# Patient Record
Sex: Female | Born: 1975 | Race: Black or African American | Hispanic: No | Smoking: Never smoker
Health system: Southern US, Community
[De-identification: ages and names within clinical notes are randomized; demographics above are authoritative.]

## PROBLEM LIST (undated history)

## (undated) ENCOUNTER — Inpatient Hospital Stay (HOSPITAL_COMMUNITY): Payer: Self-pay

## (undated) DIAGNOSIS — D219 Benign neoplasm of connective and other soft tissue, unspecified: Secondary | ICD-10-CM

---

## 2002-12-12 HISTORY — PX: BREAST REDUCTION SURGERY: SHX8

## 2004-09-12 ENCOUNTER — Inpatient Hospital Stay (HOSPITAL_COMMUNITY): Admission: AD | Admit: 2004-09-12 | Discharge: 2004-09-12 | Payer: Self-pay | Admitting: Obstetrics and Gynecology

## 2004-11-22 ENCOUNTER — Inpatient Hospital Stay (HOSPITAL_COMMUNITY): Admission: AD | Admit: 2004-11-22 | Discharge: 2004-11-23 | Payer: Self-pay | Admitting: Obstetrics and Gynecology

## 2009-02-20 ENCOUNTER — Other Ambulatory Visit: Admission: RE | Admit: 2009-02-20 | Discharge: 2009-02-20 | Payer: Self-pay | Admitting: Family Medicine

## 2009-02-20 ENCOUNTER — Ambulatory Visit: Payer: Self-pay | Admitting: Family Medicine

## 2009-02-20 ENCOUNTER — Encounter: Payer: Self-pay | Admitting: Family Medicine

## 2009-02-20 DIAGNOSIS — R1084 Generalized abdominal pain: Secondary | ICD-10-CM | POA: Insufficient documentation

## 2009-02-20 DIAGNOSIS — R109 Unspecified abdominal pain: Secondary | ICD-10-CM | POA: Insufficient documentation

## 2009-02-20 LAB — CONVERTED CEMR LAB
Bilirubin Urine: NEGATIVE
Nitrite: NEGATIVE
Protein, U semiquant: NEGATIVE
Urobilinogen, UA: NEGATIVE
WBC Urine, dipstick: NEGATIVE

## 2009-02-24 ENCOUNTER — Encounter (INDEPENDENT_AMBULATORY_CARE_PROVIDER_SITE_OTHER): Payer: Self-pay | Admitting: *Deleted

## 2009-02-26 ENCOUNTER — Telehealth (INDEPENDENT_AMBULATORY_CARE_PROVIDER_SITE_OTHER): Payer: Self-pay | Admitting: *Deleted

## 2009-02-26 LAB — CONVERTED CEMR LAB
ALT: 16 units/L (ref 0–35)
AST: 21 units/L (ref 0–37)
Basophils Absolute: 0 10*3/uL (ref 0.0–0.1)
Basophils Relative: 0.1 % (ref 0.0–3.0)
Bilirubin, Direct: 0.1 mg/dL (ref 0.0–0.3)
CO2: 31 meq/L (ref 19–32)
Calcium: 9 mg/dL (ref 8.4–10.5)
Chloride: 103 meq/L (ref 96–112)
Creatinine, Ser: 1 mg/dL (ref 0.4–1.2)
Glucose, Bld: 85 mg/dL (ref 70–99)
Hemoglobin: 12.7 g/dL (ref 12.0–15.0)
LDL Cholesterol: 133 mg/dL — ABNORMAL HIGH (ref 0–99)
Lymphocytes Relative: 42.9 % (ref 12.0–46.0)
MCHC: 33.7 g/dL (ref 30.0–36.0)
Monocytes Relative: 8.8 % (ref 3.0–12.0)
Neutro Abs: 2.7 10*3/uL (ref 1.4–7.7)
Neutrophils Relative %: 44.2 % (ref 43.0–77.0)
RBC: 4.17 M/uL (ref 3.87–5.11)
TSH: 0.91 microintl units/mL (ref 0.35–5.50)
Total Bilirubin: 0.7 mg/dL (ref 0.3–1.2)
Total CHOL/HDL Ratio: 4.3
Total Protein: 6.9 g/dL (ref 6.0–8.3)
VLDL: 15 mg/dL (ref 0–40)
WBC: 6 10*3/uL (ref 4.5–10.5)

## 2009-03-05 ENCOUNTER — Encounter: Admission: RE | Admit: 2009-03-05 | Discharge: 2009-03-05 | Payer: Self-pay | Admitting: Family Medicine

## 2009-03-06 ENCOUNTER — Telehealth (INDEPENDENT_AMBULATORY_CARE_PROVIDER_SITE_OTHER): Payer: Self-pay | Admitting: *Deleted

## 2009-03-09 ENCOUNTER — Telehealth (INDEPENDENT_AMBULATORY_CARE_PROVIDER_SITE_OTHER): Payer: Self-pay | Admitting: *Deleted

## 2009-12-10 ENCOUNTER — Telehealth: Payer: Self-pay | Admitting: Family Medicine

## 2010-02-01 ENCOUNTER — Ambulatory Visit: Payer: Self-pay | Admitting: Family Medicine

## 2010-02-01 DIAGNOSIS — R05 Cough: Secondary | ICD-10-CM

## 2010-02-01 LAB — CONVERTED CEMR LAB
ALT: 12 units/L (ref 0–35)
AST: 15 units/L (ref 0–37)
Alkaline Phosphatase: 59 units/L (ref 39–117)
Basophils Relative: 0.7 % (ref 0.0–3.0)
Bilirubin, Direct: 0.1 mg/dL (ref 0.0–0.3)
Calcium: 9.1 mg/dL (ref 8.4–10.5)
Creatinine, Ser: 1 mg/dL (ref 0.4–1.2)
Eosinophils Absolute: 0.1 10*3/uL (ref 0.0–0.7)
Eosinophils Relative: 3.1 % (ref 0.0–5.0)
HDL: 49.3 mg/dL (ref >39.00)
Hemoglobin: 12.6 g/dL (ref 12.0–15.0)
LDL Cholesterol: 119 mg/dL — ABNORMAL HIGH (ref 0–99)
Lymphocytes Relative: 37 % (ref 12.0–46.0)
Monocytes Relative: 7.5 % (ref 3.0–12.0)
Neutro Abs: 1.9 10*3/uL (ref 1.4–7.7)
Neutrophils Relative %: 51.7 % (ref 43.0–77.0)
RBC: 3.91 M/uL (ref 3.87–5.11)
Sodium: 141 meq/L (ref 135–145)
Total CHOL/HDL Ratio: 4
Total Protein: 6.9 g/dL (ref 6.0–8.3)
Triglycerides: 51 mg/dL (ref 0.0–149.0)
WBC: 3.6 10*3/uL — ABNORMAL LOW (ref 4.5–10.5)

## 2010-02-15 LAB — CONVERTED CEMR LAB

## 2010-02-19 ENCOUNTER — Ambulatory Visit: Payer: Self-pay | Admitting: Family Medicine

## 2010-03-17 ENCOUNTER — Ambulatory Visit: Payer: Self-pay | Admitting: Family Medicine

## 2010-03-17 DIAGNOSIS — N390 Urinary tract infection, site not specified: Secondary | ICD-10-CM

## 2010-03-17 LAB — CONVERTED CEMR LAB
Bilirubin Urine: NEGATIVE
Blood in Urine, dipstick: NEGATIVE
Ketones, urine, test strip: NEGATIVE
Urobilinogen, UA: 0.2
pH: 7.5

## 2010-03-24 ENCOUNTER — Ambulatory Visit: Payer: Self-pay | Admitting: Family Medicine

## 2010-03-24 DIAGNOSIS — N898 Other specified noninflammatory disorders of vagina: Secondary | ICD-10-CM | POA: Insufficient documentation

## 2010-03-24 DIAGNOSIS — N760 Acute vaginitis: Secondary | ICD-10-CM | POA: Insufficient documentation

## 2010-03-24 DIAGNOSIS — A5901 Trichomonal vulvovaginitis: Secondary | ICD-10-CM | POA: Insufficient documentation

## 2010-03-24 LAB — CONVERTED CEMR LAB
Ketones, urine, test strip: NEGATIVE
Nitrite: NEGATIVE
Protein, U semiquant: NEGATIVE
Specific Gravity, Urine: 1.015
Whiff Test: POSITIVE
pH: 6

## 2010-03-25 ENCOUNTER — Encounter: Payer: Self-pay | Admitting: Family Medicine

## 2010-03-26 ENCOUNTER — Telehealth (INDEPENDENT_AMBULATORY_CARE_PROVIDER_SITE_OTHER): Payer: Self-pay | Admitting: *Deleted

## 2010-03-26 LAB — CONVERTED CEMR LAB: GC Probe Amp, Urine: NEGATIVE

## 2010-03-29 ENCOUNTER — Ambulatory Visit: Payer: Self-pay | Admitting: Family Medicine

## 2010-03-29 DIAGNOSIS — A5601 Chlamydial cystitis and urethritis: Secondary | ICD-10-CM

## 2010-06-01 ENCOUNTER — Ambulatory Visit: Payer: Self-pay | Admitting: Internal Medicine

## 2010-06-22 ENCOUNTER — Ambulatory Visit: Payer: Self-pay | Admitting: Family Medicine

## 2011-01-12 NOTE — Assessment & Plan Note (Signed)
Summary: PT IS HAVING DISCHARGE/KDC   Vital Signs:  Patient profile:   35 year old female Menstrual status:  regular Weight:      197 pounds Pulse rate:   72 / minute Pulse rhythm:   regular BP sitting:   122 / 80  (left arm) Cuff size:   regular  Vitals Entered By: Army Fossa CMA (March 24, 2010 10:12 AM) CC: Pt here for vaginal discharge. , Vaginal discharge   History of Present Illness:  Vaginal discharge      This is a 35 year old woman who presents with Vaginal discharge.  The patient complains of itching, but denies vaginal burning, burning on urination, frequency, urgency, fever, pelvic pain, and back pain.  The discharge is described as white and malodorous.  Risk factors for vaginal discharge include possible STD exposure.  The patient denies the following symptoms: genital sores, unusual vaginal bleeding, painful intercourse, rash, myalgias, arthralgias, and headache.  Prior treatment has included no prior treatment.    Current Medications (verified): 1)  Macrobid 100 Mg Caps (Nitrofurantoin Monohyd Macro) .Marland Kitchen.. 1 By Mouth Two Times A Day X7 Days 2)  Flagyl 500 Mg Tabs (Metronidazole) .Marland Kitchen.. 1 By Mouth Two Times A Day  Allergies (verified): No Known Drug Allergies  Past History:  Past medical, surgical, family and social histories (including risk factors) reviewed for relevance to current acute and chronic problems.  Past Medical History: Reviewed history from 02/20/2009 and no changes required. 3 children-natural 2 boys,1 girl 9,7 and 4  Mother-HTN, high cholesterol  Past Surgical History: Reviewed history from 02/20/2009 and no changes required. breast reduction 2004  Family History: Reviewed history from 02/01/2010 and no changes required. Maunt--bipolar  Family History Diabetes 1st degree relative Family History Hypertension Family History Thyroid disease  Social History: Reviewed history from 02/20/2009 and no changes required. 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 Genitalia:  no external lesions and vaginal discharge.   Psych:  Oriented X3 and normally interactive.     Impression & Recommendations:  Problem # 1:  TRICHOMONAL VAGINITIS (ICD-131.01)  flagyl 500 mg two times a day  pt to inform partner   Orders: UA Dipstick w/o Micro (manual) (16109) Wet Prep ([id number])  Problem # 2:  VAGINITIS, BACTERIAL (ICD-616.10)  Her updated medication list for this problem includes:    Macrobid 100 Mg Caps (Nitrofurantoin monohyd macro) .Marland Kitchen... 1 by mouth two times a day x7 days    Flagyl 500 Mg Tabs (Metronidazole) .Marland Kitchen... 1 by mouth two times a day  Discussed symptomatic relief and treatment options.   Orders: UA Dipstick w/o Micro (manual) (81191) Wet Prep ([id number])  Complete Medication List: 1)  Macrobid 100 Mg Caps (Nitrofurantoin monohyd macro) .Marland Kitchen.. 1 by mouth two times a day x7 days 2)  Flagyl 500 Mg Tabs (Metronidazole) .Marland Kitchen.. 1 by mouth two times a day  Other Orders: T-Chlamydia  Probe, urine [id number]) T-GC Probe, urine [id number]) T-Culture, Urine ([id number]) Prescriptions: FLAGYL 500 MG TABS (METRONIDAZOLE) 1 by mouth two times a day  #14 x 0   Entered and Authorized by:   Loreen Freud DO   Signed by:   Loreen Freud DO on 03/24/2010   Method used:  [facility] to        [id number] (retail)       [address]       [place], [place]  [address]       Ph: [phone] or [phone]       Fax: [phone]   RxID:   [id number]   Laboratory Results   Urine Tests    Routine Urinalysis   Color: yellow Appearance:  Clear Glucose: negative   (Normal Range: Negative) Bilirubin: negative   (Normal Range: Negative) Ketone: negative   (Normal Range: Negative) Spec. Gravity: 1.015   (Normal Range: 1.003-1.035) Blood: trace-intact   (Normal Range: Negative) pH: 6.0   (Normal Range: 5.0-8.0) Protein: negative   (Normal Range: Negative) Urobilinogen: 0.2   (Normal Range: 0-1) Nitrite: negative   (Normal Range: Negative) Leukocyte Esterace: small   (Normal Range: Negative)    Urine HCG: negative Comments: Army Fossa CMA  March 24, 2010 10:22 AM    Wet Mount Source: vaginal WBC/hpf: 10-20 Bacteria/hpf: 2+  Cocci Clue cells/hpf: many  Positive whiff Yeast/hpf: none Wet Mount KOH: Negative Trichomonas/hpf: many

## 2011-01-12 NOTE — Progress Notes (Signed)
Summary: Labs/Appt  Phone Note Outgoing Call   Call placed by: Army Fossa CMA,  March 26, 2010 10:14 AM Summary of Call: Marti Sleigh called pt to have pt schedule appt for treatment due to her lab results. Left message for pt to call back. Army Fossa CMA  March 26, 2010 10:15 AM   Follow-up for Phone Call        I called pt and left another message for her to call back. Army Fossa CMA  March 26, 2010 1:25 PM  urine Culture results-contaminated----if still symptomatic---repeat Signed by Loreen Freud DO on 03/26/2010 at 2:23 PM  Additional Follow-up for Phone Call Additional follow up Details #1::        Patient is coming in today for treatment Harold Barban  March 29, 2010 10:04 AM

## 2011-01-12 NOTE — Assessment & Plan Note (Signed)
Summary: treatment//lch   Vital Signs:  Patient profile:   35 year old female Menstrual status:  regular Weight:      197 pounds Pulse rate:   78 / minute Pulse rhythm:   regular BP sitting:   120 / 80  (left arm) Cuff size:   regular  Vitals Entered By: Army Fossa CMA (March 29, 2010 1:37 PM) CC: Pt here for treatment for [place]ym[place].    History of Present Illness: Pt here to go over recent lab results---+ chlamydia.  Pt is separated from husband and had  "a date"  with someone else.     Current Medications (verified): 1)  Macrobid 100 Mg Caps (Nitrofurantoin Monohyd Macro) .Marland Kitchen.. 1 By Mouth Two Times A Day X7 Days 2)  Flagyl 500 Mg Tabs (Metronidazole) .Marland Kitchen.. 1 By Mouth Two Times A Day 3)  Zithromax 1 Gm Pack (Azithromycin) .Marland Kitchen.. 1 Pack By Mouth X1  Allergies (verified): No Known Drug Allergies  Past History:  Past medical, surgical, family and social histories (including risk factors) reviewed for relevance to current acute and chronic problems.  Past Medical History: Reviewed history from 02/20/2009 and no changes required. 3 children-natural 2 boys,1 girl 9,7 and 4  Mother-HTN, high cholesterol  Past Surgical History: Reviewed history from 02/20/2009 and no changes required. breast reduction 2004  Family History: Reviewed history from 02/01/2010 and no changes required. Maunt--bipolar  Family History Diabetes 1st degree relative Family History Hypertension Family History Thyroid disease  Social History: Reviewed history from 02/20/2009 and no changes required. 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     Impression & Recommendations:  Problem # 1:  CHLAMYDIAL INFECTION  (ICD-099.41)  zithromax 1g x1 rocephin 250 mg IM   Orders: Admin of Therapeutic Inj  intramuscular or subcutaneous (16109) Rocephin  250mg  (U0454) Pt will inform her partner---d/w pt importance of using condoms  Complete Medication List: 1)  Macrobid 100 Mg Caps (Nitrofurantoin monohyd macro) .Marland Kitchen.. 1 by mouth two times a day x7 days 2)  Flagyl 500 Mg Tabs (Metronidazole) .Marland Kitchen.. 1 by mouth two times a day 3)  Zithromax 1 Gm Pack (Azithromycin) .Marland Kitchen.. 1 pack by mouth x1 Prescriptions: ZITHROMAX 1 GM PACK (AZITHROMYCIN) 1 pack by mouth x1  #1 x 0   Entered and Authorized by:   Loreen Freud DO   Signed by:   Loreen Freud DO on 03/29/2010   Method used:   [facility]ically to        [facility] [phone][facility] (retail)       [address]       [place], [place]  [address]       Ph: [phone] or [phone]       Fax: [phone]   RxID:   [id number] ZITHROMAX 250 MG TABS (AZITHROMYCIN) 4 tabs by mouth x1  #4 x 0   Entered and Authorized by:   Loreen Freud DO   Signed by:   Loreen Freud DO on 03/29/2010   Method used:   [facility]ronically to        [facility] [phone]* (retail)       [address]       [place]  [place], [place]  [address]       Ph: [phone] or [phone]       Fax: [phone]   RxID:   [id number]    Medication Administration  Injection # 1:    Medication: Rocephin  250mg     Diagnosis: CHLAMYDIAL INFECTION (ICD-099.41)    Route: IM    Site: RUOQ gluteus    Exp Date: 03/2011    Lot #: [id number]    Mfr: novaplus    Comments: received 500 mg    Patient tolerated injection without complications    Given by: Army Fossa CMA (March 29, 2010 2:29 PM)  Orders Added: 1)  Est. Patient Level II [57322] 2)  Admin of Therapeutic Inj  intramuscular or subcutaneous [96372] 3)  Rocephin  250mg  [J0696]

## 2011-01-12 NOTE — Assessment & Plan Note (Signed)
Summary: ? UTI/RH.......Marland Kitchen   Vital Signs:  Patient profile:   35 year old female Menstrual status:  regular Weight:      196 pounds Pulse rate:   70 / minute BP sitting:   110 / 80  (left arm)  Vitals Entered By: Doristine Devoid (March 17, 2010 9:15 AM) CC: uti sx x1 week- dysuria    History of Present Illness: 35 yo woman here today for 1 week of dysuria.  has had UTI in past s/p intercourse.  sxs started morning after intercourse.  + burning, hesitancy, frequency, urgency.  reports cloudy urine.  no fevers or chills.  no back pain.  Allergies (verified): No Known Drug Allergies  Review of Systems      See HPI  Physical Exam  General:  Well-developed,well-nourished,in no acute distress; alert,appropriate and cooperative throughout examination Abdomen:  no suprapubic or CVA tenderness   Impression & Recommendations:  Problem # 1:  UTI (ICD-599.0) Assessment New  sxs and presence of bacteria in urine consistent w/ UTI.  pt reports unable to take pills but can open capsules in apple sauce.  tx w/ Macrobid. Her updated medication list for this problem includes:    Macrobid 100 Mg Caps (Nitrofurantoin monohyd macro) .Marland Kitchen... 1 by mouth two times a day x7 days  Orders: UA Dipstick w/o Micro (manual) (16109) Specimen Handling (99000) T-Culture, Urine ([id number])  Complete Medication List: 1)  Macrobid 100 Mg Caps (Nitrofurantoin monohyd macro) .Marland Kitchen.. 1 by mouth two times a day x7 days  Patient Instructions: 1)  Please schedule a follow-up appointment as needed. 2)  Drink plenty of fluids 3)  Take the medicine as directed 4)  Urinate after sex to decrease chance of infection 5)  Hang in there!  Prescriptions: MACROBID 100 MG CAPS (NITROFURANTOIN MONOHYD MACRO) 1 by mouth two times a day x7 days  #14 x 0   Entered and Authorized by:   Neena Rhymes MD   Signed by:   Neena Rhymes MD on 03/17/2010   Method used:   Print then Give to Patient   RxID:    [id number]   Laboratory Results   Urine Tests    Routine Urinalysis   Glucose: negative   (Normal Range: Negative) Bilirubin: negative   (Normal Range: Negative) Ketone: negative   (Normal Range: Negative) Spec. Gravity: 1.015   (Normal Range: 1.003-1.035) Blood: negative   (Normal Range: Negative) pH: 7.5   (Normal Range: 5.0-8.0) Protein: negative   (Normal Range: Negative) Urobilinogen: 0.2   (Normal Range: 0-1) Nitrite: negative   (Normal Range: Negative) Leukocyte Esterace: large   (Normal Range: Negative)

## 2011-01-12 NOTE — Assessment & Plan Note (Signed)
Summary: CPX//PH   Vital Signs:  Patient profile:   35 year old female Menstrual status:  regular Height:      66.5 inches Weight:      207 pounds BMI:     33.03 Temp:     98.7 degrees F oral Pulse rate:   75 / minute Pulse rhythm:   regular BP sitting:   122 / 70  (left arm) Cuff size:   regular  Vitals Entered By: Army Fossa CMA (February 01, 2010 9:01 AM) CC: CPX, no pap (started period)   History of Present Illness: Pt here for cpe. No pap. She is on period.  Pt with no complaints except dry cough at night since summer time.     Preventive Screening-Counseling & Management  Alcohol-Tobacco     Alcohol drinks/day: <1     Alcohol type: daquari     >5/day in last 3 mos: no     Alcohol Counseling: not indicated; use of alcohol is not excessive or problematic     Smoking Status: never     Passive Smoke Exposure: no  Caffeine-Diet-Exercise     Caffeine use/day: no     Diet Comments: watch diet ,  water     Does Patient Exercise: no     Exercise Counseling: to improve exercise regimen  Hep-HIV-STD-Contraception     Dental Visit-last 6 months yes     Dental Care Counseling: not indicated; dental care within six months     SBE monthly: no     SBE Education/Counseling: to perform regular SBE  Safety-Violence-Falls     Seat Belt Use: yes     Seat Belt Counseling: not indicated; patient wears seat belts      Sexual History:  currently monogamous and married-- 3 children.    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 Maunt--bipolar  Family History Diabetes 1st degree relative Family History Hypertension Family History Thyroid disease  Social History: Reviewed history from 02/20/2009 and no changes required. 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 Eyes:  Denies blurring, discharge, double vision, eye irritation, eye pain, halos, itching, light sensitivity, red eye, vision loss-1 eye, and vision loss-both eyes; optho--q2y. ENT:  Denies decreased hearing, difficulty swallowing, ear discharge, earache, hoarseness, nasal congestion, nosebleeds, postnasal drainage, ringing in ears, sinus pressure, and sore throat. CV:  Denies bluish discoloration of lips or nails, chest pain  or discomfort, difficulty breathing at night, difficulty breathing while lying down, fainting, fatigue, leg cramps with exertion, lightheadness, near fainting, palpitations, shortness of breath with exertion, swelling of feet, swelling of hands, and weight gain. Resp:  Complains of cough; denies chest discomfort, chest pain with inspiration, coughing up blood, excessive snoring, hypersomnolence, morning headaches, pleuritic, shortness of  breath, sputum productive, and wheezing. GI:  Denies abdominal pain, bloody stools, change in bowel habits, constipation, dark tarry stools, diarrhea, excessive appetite, gas, hemorrhoids, indigestion, loss of appetite, nausea, vomiting, vomiting blood, and yellowish skin color. GU:  Denies abnormal vaginal bleeding, decreased libido, discharge, dysuria, genital sores, hematuria, incontinence, nocturia, urinary frequency, and urinary hesitancy. MS:  Complains of joint pain; denies joint redness, joint swelling, loss of strength, low back pain, mid back pain, muscle aches, muscle , cramps, muscle weakness, stiffness, and thoracic pain; Dr Evaristo Bury at [facility] ortho for bursitis . Derm:  Denies changes in color of skin, changes in nail beds, dryness, excessive perspiration, flushing, hair loss, insect bite(s), itching, lesion(s), poor wound healing, and rash. Neuro:  Denies brief paralysis, difficulty with concentration, disturbances in coordination, falling down, headaches, inability to speak, memory loss, numbness, poor balance, seizures, sensation of room spinning, tingling, tremors, visual disturbances, and weakness. Psych:  Denies alternate hallucination ( auditory/visual), anxiety, depression, easily angered, easily tearful, irritability, mental problems, panic attacks, sense of great danger, suicidal thoughts/plans, thoughts of violence, unusual visions or sounds, and thoughts /plans of harming others. Endo:  Denies cold intolerance, excessive hunger, excessive thirst, excessive urination, heat intolerance, polyuria, and weight change. Heme:  Denies abnormal bruising, bleeding, enlarge lymph nodes, fevers, pallor, and skin discoloration. Allergy:  Denies hives or rash, itching eyes, persistent infections, seasonal allergies, and sneezing.  Physical Exam  General:  Well-developed,well-nourished,in no acute distress; alert,appropriate and cooperative throughout examination Head:  Normocephalic and  atraumatic without obvious abnormalities. No apparent alopecia or balding. Eyes:  vision grossly intact, pupils equal, pupils round, pupils reactive to light, and no injection.   Ears:  External ear exam shows no significant lesions or deformities.  Otoscopic examination reveals clear canals, tympanic membranes are intact bilaterally without bulging, retraction, inflammation or discharge. Hearing is grossly normal bilaterally. Nose:  External nasal examination shows no deformity or inflammation. Nasal mucosa are pink and moist without lesions or exudates. Mouth:  Oral mucosa and oropharynx without lesions or exudates.  Teeth in good repair. Neck:  No deformities, masses, or tenderness noted. Chest Wall:  No deformities, masses, or tenderness noted. Breasts:  No mass, nodules, thickening, tenderness, bulging, retraction, inflamation, nipple discharge or skin changes noted.   Lungs:  Normal respiratory effort, chest expands symmetrically. Lungs are clear to auscultation, no crackles or wheezes. Heart:  normal rate, regular rhythm, and no murmur.   Abdomen:  Bowel sounds positive,abdomen soft and non-tender without masses, organomegaly or hernias noted. Genitalia:  deferred--pt on period Msk:  normal ROM, no joint tenderness, no joint swelling, no joint warmth, no redness over joints, no joint deformities, no joint instability, and no crepitation.   Pulses:  R posterior tibial normal, R dorsalis pedis normal, R carotid normal, L posterior tibial normal, L dorsalis pedis normal, and L carotid normal.   Extremities:  No clubbing, cyanosis, edema, or deformity noted with normal full range of motion of all joints.   Neurologic:  No cranial nerve deficits noted. Station and gait are normal. Plantar reflexes are down-going bilaterally. DTRs are symmetrical throughout. Sensory, motor and coordinative functions appear intact. Skin:  Intact  without suspicious lesions or rashes Cervical Nodes:  No  lymphadenopathy noted Axillary Nodes:  No palpable lymphadenopathy Psych:  Cognition and judgment appear intact. Alert and cooperative with normal attention span and concentration. No apparent delusions, illusions, hallucinations   Impression & Recommendations:  Problem # 1:  PREVENTIVE HEALTH CARE (ICD-V70.0) ghm utd  Orders: Venipuncture (98119) TLB-Lipid Panel (80061-LIPID) TLB-BMP (Basic Metabolic Panel-BMET) (80048-METABOL) TLB-CBC Platelet - w/Differential (85025-CBCD) TLB-Hepatic/Liver Function Pnl (80076-HEPATIC) TLB-TSH (Thyroid Stimulating Hormone) (84443-TSH) T-HIV Antibody  (Reflex) ([id number]) T-RPR (Syphilis) (30865-78469) T- * Misc. Laboratory test [phone]) T-GC Probe, urine ([phone]) T-Chlamydia  Probe, urine [phone])  Problem # 2:  COUGH (ICD-786.2) ? allergies take otc zyrtec or claritin Orders: Venipuncture (34742) TLB-Lipid Panel (80061-LIPID) TLB-BMP (Basic Metabolic Panel-BMET) (80048-METABOL) TLB-CBC Platelet - w/Differential (85025-CBCD) TLB-Hepatic/Liver Function Pnl (80076-HEPATIC) TLB-TSH (Thyroid Stimulating Hormone) (84443-TSH) T-HIV Antibody  (Reflex) ([id number]) T-RPR (Syphilis) (33295-18841) T- * Misc. Laboratory test [phone]) T-Allergy Profile [place], [place], [place], [place], [place] [phone])  Patient Instructions: 1)  RTO pap only in 2 weeks    Flu Vaccine Next Due:  Refused

## 2011-01-27 ENCOUNTER — Encounter: Payer: Self-pay | Admitting: Family Medicine

## 2011-02-15 ENCOUNTER — Encounter: Payer: Self-pay | Admitting: Family Medicine

## 2011-02-15 DIAGNOSIS — Z0289 Encounter for other administrative examinations: Secondary | ICD-10-CM

## 2011-03-30 ENCOUNTER — Encounter: Payer: Self-pay | Admitting: Family Medicine

## 2011-04-04 ENCOUNTER — Encounter: Payer: Self-pay | Admitting: Family Medicine

## 2011-04-28 ENCOUNTER — Ambulatory Visit (INDEPENDENT_AMBULATORY_CARE_PROVIDER_SITE_OTHER): Payer: Self-pay | Admitting: Family Medicine

## 2011-04-28 DIAGNOSIS — R300 Dysuria: Secondary | ICD-10-CM

## 2011-04-28 NOTE — Progress Notes (Signed)
  Subjective:    Patient ID: Leslie Richardson, female    DOB: 20-Feb-1976, 35 y.o.   MRN: [id number]  HPI  Pt no showed  Review of Systems     Objective:   Physical Exam        Assessment & Plan:

## 2011-04-28 NOTE — Patient Instructions (Signed)

## 2011-05-11 ENCOUNTER — Encounter: Payer: Self-pay | Admitting: Family Medicine

## 2011-05-16 ENCOUNTER — Encounter: Payer: Self-pay | Admitting: Family Medicine

## 2011-05-18 ENCOUNTER — Ambulatory Visit (INDEPENDENT_AMBULATORY_CARE_PROVIDER_SITE_OTHER): Payer: Self-pay | Admitting: Family Medicine

## 2011-05-18 DIAGNOSIS — Z0000 Encounter for general adult medical examination without abnormal findings: Secondary | ICD-10-CM

## 2011-05-18 NOTE — Progress Notes (Signed)
  Subjective:    Patient ID: Leslie Richardson, female    DOB: 13-Oct-1976, 35 y.o.   MRN: [id number]  HPI   No show Review of Systems     Objective:   Physical Exam        Assessment & Plan:

## 2012-06-13 ENCOUNTER — Encounter (HOSPITAL_BASED_OUTPATIENT_CLINIC_OR_DEPARTMENT_OTHER): Payer: Self-pay | Admitting: *Deleted

## 2012-06-13 ENCOUNTER — Emergency Department (HOSPITAL_BASED_OUTPATIENT_CLINIC_OR_DEPARTMENT_OTHER): Payer: Self-pay

## 2012-06-13 ENCOUNTER — Emergency Department (HOSPITAL_BASED_OUTPATIENT_CLINIC_OR_DEPARTMENT_OTHER)
Admission: EM | Admit: 2012-06-13 | Discharge: 2012-06-13 | Disposition: A | Discharge status: Home / Self Care | Payer: Self-pay | Attending: Emergency Medicine | Admitting: Emergency Medicine

## 2012-06-13 ENCOUNTER — Ambulatory Visit: Payer: Self-pay | Admitting: Internal Medicine

## 2012-06-13 DIAGNOSIS — R6889 Other general symptoms and signs: Secondary | ICD-10-CM | POA: Insufficient documentation

## 2012-06-13 DIAGNOSIS — Z818 Family history of other mental and behavioral disorders: Secondary | ICD-10-CM | POA: Insufficient documentation

## 2012-06-13 DIAGNOSIS — T17208A Unspecified foreign body in pharynx causing other injury, initial encounter: Secondary | ICD-10-CM

## 2012-06-13 DIAGNOSIS — Z8349 Family history of other endocrine, nutritional and metabolic diseases: Secondary | ICD-10-CM | POA: Insufficient documentation

## 2012-06-13 DIAGNOSIS — Z8249 Family history of ischemic heart disease and other diseases of the circulatory system: Secondary | ICD-10-CM | POA: Insufficient documentation

## 2012-06-13 DIAGNOSIS — Z833 Family history of diabetes mellitus: Secondary | ICD-10-CM | POA: Insufficient documentation

## 2012-06-13 MED ORDER — CLINDAMYCIN HCL 300 MG PO CAPS: 300.0000 mg | ORAL_CAPSULE | Freq: Four times a day (QID) | ORAL | Status: AC

## 2012-06-13 NOTE — ED Notes (Signed)
Returned from XR 

## 2012-06-13 NOTE — ED Notes (Signed)
Pt denies difficulty swallowing food/fluids/secretions. Denies airway impairment.

## 2012-06-13 NOTE — ED Provider Notes (Signed)
History     CSN: [id number]  Arrival date & time 06/13/12  0209   First MD Initiated Contact with Patient 06/13/12 0235      Chief Complaint  Patient presents with  . Swallowed Foreign Body    (Consider location/radiation/quality/duration/timing/severity/associated sxs/prior treatment) Patient is a 36 y.o. female presenting with foreign body swallowed. The history is provided by the patient. No language interpreter was used.  Swallowed Foreign Body This is a new problem. The current episode started 2 days ago. The problem occurs constantly. The problem has not changed since onset.Pertinent negatives include no chest pain, no abdominal pain, no headaches and no shortness of breath. Nothing aggravates the symptoms. Nothing relieves the symptoms. She has tried water for the symptoms. The treatment provided no relief.  States the wire between her braces was sticking into her gum on Monday so she cut it with curved fingernail cutters and it went in her throat, she thought she felt it and tried to dislodge it with the clippers and has noted blood.  Still feels it.  No f/c/r.    History reviewed. No pertinent past medical history.  Past Surgical History  Procedure Date  . Breast reduction surgery 2004    Family History  Problem Relation Age of Onset  . Hypertension Mother   . Hyperlipidemia Mother   . Bipolar disorder Maternal Aunt   . Diabetes    . Thyroid disease    . Hypertension      History  Substance Use Topics  . Smoking status: Never Smoker   . Smokeless tobacco: Not on file  . Alcohol Use: No    OB History    Grav Para Term Preterm Abortions TAB SAB Ect Mult Living                  Review of Systems  Respiratory: Negative for shortness of breath.   Cardiovascular: Negative for chest pain.  Gastrointestinal: Negative for abdominal pain.  Neurological: Negative for headaches.  All other systems reviewed and are negative.    Allergies  Review of patient's  allergies indicates no known allergies.  Home Medications   Current Outpatient Rx  Name Route Sig Dispense Refill  . NITROFURANTOIN MONOHYD MACRO 100 MG PO CAPS Oral Take 100 mg by mouth 2 (two) times daily. For 7 days       BP 146/83  Pulse 66  Temp 98.4 F (36.9 C) (Oral)  Resp 18  Ht 5\' 6"  (1.676 m)  Wt 210 lb (95.255 kg)  BMI 33.89 kg/m2  SpO2 100%  LMP 03/28/2012  Physical Exam  Constitutional: She is oriented to person, place, and time. She appears well-developed and well-nourished. No distress.  HENT:  Head: Normocephalic and atraumatic.  Mouth/Throat: Oropharynx is clear and moist.  Eyes: Conjunctivae are normal. Pupils are equal, round, and reactive to light.  Neck: Normal range of motion. Neck supple.  Cardiovascular: Normal rate and regular rhythm.   Pulmonary/Chest: Effort normal and breath sounds normal. No stridor. She has no wheezes.  Abdominal: Soft. Bowel sounds are normal.  Musculoskeletal: Normal range of motion.  Lymphadenopathy:    She has no cervical adenopathy.  Neurological: She is alert and oriented to person, place, and time.  Skin: Skin is warm and dry.  Psychiatric: She has a normal mood and affect.    ED Course  Procedures (including critical care time)   Labs Reviewed  PREGNANCY, URINE   No results found.   No diagnosis found.  MDM  89 am case d/w Dr. Suszanne Conners.  Due to concern for retained foreign body and laceration caused by self instrumentation patient to be prescribed antibiotics and Dr. Suszanne Conners will see patient in the office at 1030 am for scope.  Patient is NPO as of now.  Patient informed antibiotics can invalidate oral contraception and to use barrier protection x 1 month to prevent pregnancy.  Patient verbalizes understanding and agrees to follow up        Zyren Sevigny Smitty Cords, MD 06/13/12 [phone]

## 2012-06-13 NOTE — ED Notes (Signed)
Pt has braces. States there was a metal piece sticking into her gum, so she took fingernail clippers to clip it on Monday, and it fell into her throat. States she felt like she could pass it by drinking fluids, but that the metal piece is stuck against her throat. Pt attempted to dislodge it and noticed small amt of blood. Pt also c/o possible sty to left eye. Using warm compresses without relief.

## 2013-06-16 ENCOUNTER — Telehealth (HOSPITAL_COMMUNITY): Payer: Self-pay | Admitting: *Deleted

## 2013-06-16 ENCOUNTER — Emergency Department (HOSPITAL_BASED_OUTPATIENT_CLINIC_OR_DEPARTMENT_OTHER)
Admission: EM | Admit: 2013-06-16 | Discharge: 2013-06-16 | Disposition: A | Discharge status: Home / Self Care | Payer: Medicaid Other | Attending: Emergency Medicine | Admitting: Emergency Medicine

## 2013-06-16 ENCOUNTER — Emergency Department (HOSPITAL_BASED_OUTPATIENT_CLINIC_OR_DEPARTMENT_OTHER): Payer: Medicaid Other

## 2013-06-16 ENCOUNTER — Encounter (HOSPITAL_BASED_OUTPATIENT_CLINIC_OR_DEPARTMENT_OTHER): Payer: Self-pay | Admitting: *Deleted

## 2013-06-16 DIAGNOSIS — O99891 Other specified diseases and conditions complicating pregnancy: Secondary | ICD-10-CM | POA: Insufficient documentation

## 2013-06-16 DIAGNOSIS — O039 Complete or unspecified spontaneous abortion without complication: Secondary | ICD-10-CM

## 2013-06-16 DIAGNOSIS — R5381 Other malaise: Secondary | ICD-10-CM | POA: Insufficient documentation

## 2013-06-16 LAB — CBC WITH DIFFERENTIAL/PLATELET
Basophils Absolute: 0 10*3/uL (ref 0.0–0.1)
Eosinophils Absolute: 0.1 10*3/uL (ref 0.0–0.7)
Eosinophils Relative: 2 % (ref 0–5)
MCH: 30.3 pg (ref 26.0–34.0)
MCV: 86.4 fL (ref 78.0–100.0)
Platelets: 275 10*3/uL (ref 150–400)
RDW: 13.4 % (ref 11.5–15.5)

## 2013-06-16 LAB — HCG, QUANTITATIVE, PREGNANCY: hCG, Beta Chain, Quant, S: 2457 m[IU]/mL — ABNORMAL HIGH (ref <5)

## 2013-06-16 LAB — URINE MICROSCOPIC-ADD ON

## 2013-06-16 LAB — PREGNANCY, URINE: Preg Test, Ur: POSITIVE — AB

## 2013-06-16 LAB — WET PREP, GENITAL
Trich, Wet Prep: NONE SEEN
Yeast Wet Prep HPF POC: NONE SEEN

## 2013-06-16 LAB — URINALYSIS, ROUTINE W REFLEX MICROSCOPIC
Glucose, UA: NEGATIVE mg/dL
Specific Gravity, Urine: 1.026 (ref 1.005–1.030)

## 2013-06-16 NOTE — ED Notes (Signed)
Pt states unsure of last menstrual period and that her pregnancy was confirmed by the health department "over a month ago" - but unsure of date of that appt.

## 2013-06-16 NOTE — ED Provider Notes (Signed)
History    CSN: [id number] Arrival date & time 06/16/13  1112  First MD Initiated Contact with Patient 06/16/13 1153     Chief Complaint  Patient presents with  . Vaginal Discharge   (Consider location/radiation/quality/duration/timing/severity/associated sxs/prior Treatment) Patient is a 37 y.o. female presenting with vaginal bleeding. The history is provided by the patient.  Vaginal Bleeding Quality:  Spotting and dark red Severity:  Mild Onset quality:  Gradual Duration:  1 week Timing:  Rare Progression:  Worsening Chronicity:  New Menstrual history:  Irregular (breast feeding) Number of pads used:  None Possible pregnancy: yes   Relieved by:  Nothing Worsened by:  Nothing tried Associated symptoms: fatigue and vaginal discharge   Associated symptoms: no abdominal pain, no back pain, no dizziness, no dyspareunia, no dysuria, no fever and no nausea   Risk factors: unprotected sex   Risk factors: no bleeding disorder, no hx of ectopic pregnancy, no hx of endometriosis, no gynecological surgery, does not have multiple partners, no new sexual partner, no ovarian cysts, no prior miscarriage, no STD, no STD exposure and no terminated pregnancies    Leslie Richardson is a 37 y.o. female who presents to the ED with vaginal bleeding. She describes the bleeding as less than a period. The color is dark brown. She states that she is pregnant but not sure how far along. Unsure of LMP. She is breast feeing her one year old.  History reviewed. No pertinent past medical history. Past Surgical History  Procedure Laterality Date  . Breast reduction surgery  2004   Family History  Problem Relation Age of Onset  . Hypertension Mother   . Hyperlipidemia Mother   . Bipolar disorder Maternal Aunt   . Diabetes    . Thyroid disease    . Hypertension     History  Substance Use Topics  . Smoking status: Never Smoker   . Smokeless tobacco: Not on file  . Alcohol Use: No   OB History    Grav Para Term Preterm Abortions TAB SAB Ect Mult Living                 Review of Systems  Constitutional: Positive for fatigue. Negative for fever, chills and activity change.  HENT: Negative for neck pain.   Respiratory: Negative for cough and shortness of breath.   Cardiovascular: Negative for leg swelling.  Gastrointestinal: Negative for nausea and abdominal pain.  Genitourinary: Positive for vaginal bleeding and vaginal discharge. Negative for dysuria, frequency and dyspareunia.  Musculoskeletal: Negative for back pain.  Skin: Negative for rash.  Neurological: Negative for dizziness.  Psychiatric/Behavioral: The patient is not nervous/anxious.     Allergies  Review of patient's allergies indicates no known allergies.  Home Medications   Current Outpatient Rx  Name  Route  Sig  Dispense  Refill  . nitrofurantoin, macrocrystal-monohydrate, (MACROBID) 100 MG capsule   Oral   Take 100 mg by mouth 2 (two) times daily. For 7 days           There were no vitals taken for this visit. Physical Exam  Nursing note and vitals reviewed. Constitutional: She is oriented to person, place, and time. She appears well-developed and well-nourished. No distress.  HENT:  Head: Normocephalic.  Eyes: EOM are normal.  Neck: Neck supple.  Cardiovascular: Normal rate.   Pulmonary/Chest: Effort normal.  Abdominal: Soft. Bowel sounds are normal. There is no tenderness.  Genitourinary:  External genitalia without lesions. Moderate dark blood vaginal vault.  Cervix dilated. No CMT, no adnexal tenderness, uterus slightly enlarged.  Musculoskeletal: Normal range of motion.  Neurological: She is alert and oriented to person, place, and time. No cranial nerve deficit.  Skin: Skin is warm and dry.  Psychiatric: She has a normal mood and affect. Her behavior is normal. Thought content normal.   Results for orders placed during the hospital encounter of 06/16/13 (from the past 24 hour(s))   URINALYSIS, ROUTINE W REFLEX MICROSCOPIC     Status: Abnormal   Collection Time    06/16/13 11:44 AM      Result Value Range   Color, Urine AMBER (*) YELLOW   APPearance CLOUDY (*) CLEAR   Specific Gravity, Urine 1.026  1.005 - 1.030   pH 6.0  5.0 - 8.0   Glucose, UA NEGATIVE  NEGATIVE mg/dL   Hgb urine dipstick LARGE (*) NEGATIVE   Bilirubin Urine NEGATIVE  NEGATIVE   Ketones, ur NEGATIVE  NEGATIVE mg/dL   Protein, ur NEGATIVE  NEGATIVE mg/dL   Urobilinogen, UA 0.2  0.0 - 1.0 mg/dL   Nitrite NEGATIVE  NEGATIVE   Leukocytes, UA SMALL (*) NEGATIVE  PREGNANCY, URINE     Status: Abnormal   Collection Time    06/16/13 11:44 AM      Result Value Range   Preg Test, Ur POSITIVE (*) NEGATIVE  URINE MICROSCOPIC-ADD ON     Status: Abnormal   Collection Time    06/16/13 11:44 AM      Result Value Range   Squamous Epithelial / LPF FEW (*) RARE   WBC, UA 0-2  <3 WBC/hpf   RBC / HPF TOO NUMEROUS TO COUNT  <3 RBC/hpf   Bacteria, UA MANY (*) RARE   Urine-Other MUCOUS PRESENT    CBC WITH DIFFERENTIAL     Status: None   Collection Time    06/16/13 12:20 PM      Result Value Range   WBC 6.3  4.0 - 10.5 K/uL   RBC 4.19  3.87 - 5.11 MIL/uL   Hemoglobin 12.7  12.0 - 15.0 g/dL   HCT 19.1  47.8 - 29.5 %   MCV 86.4  78.0 - 100.0 fL   MCH 30.3  26.0 - 34.0 pg   MCHC 35.1  30.0 - 36.0 g/dL   RDW 62.1  30.8 - 65.7 %   Platelets 275  150 - 400 K/uL   Neutrophils Relative % 55  43 - 77 %   Neutro Abs 3.4  1.7 - 7.7 K/uL   Lymphocytes Relative 36  12 - 46 %   Lymphs Abs 2.2  0.7 - 4.0 K/uL   Monocytes Relative 7  3 - 12 %   Monocytes Absolute 0.5  0.1 - 1.0 K/uL   Eosinophils Relative 2  0 - 5 %   Eosinophils Absolute 0.1  0.0 - 0.7 K/uL   Basophils Relative 1  0 - 1 %   Basophils Absolute 0.0  0.0 - 0.1 K/uL  HCG, QUANTITATIVE, PREGNANCY     Status: Abnormal   Collection Time    06/16/13 12:20 PM      Result Value Range   hCG, Beta Chain, Quant, S 2457 (*) <5 mIU/mL  WET PREP, GENITAL      Status: Abnormal   Collection Time    06/16/13 12:22 PM      Result Value Range   Yeast Wet Prep HPF POC NONE SEEN  NONE SEEN   Trich, Wet Prep NONE SEEN  NONE SEEN   Clue Cells Wet Prep HPF POC FEW (*) NONE SEEN   WBC, Wet Prep HPF POC FEW (*) NONE SEEN     ED Course  Procedures Bedside ultrasound with early IUGS with YS approximately 6 weeks. Unable to visualize cardiac activity. Will order formal ultrasound with transvaginal probe to try and identify heart beat.   MDM  US Ob Comp Less 14 Wks  06/16/2013   , *RADIOLOGY REPORT*  Clinical Data: Vaginal bleeding.  OBSTETRIC <14 WK ULTRASOUND, TRANSVAGINAL OB US  Technique:  Transabdominal and transvaginal ultrasound was performed for evaluation of the gestation as well as the maternal uterus and adnexal regions.  Findings:  There is a single intrauterine gestational sac containing an embryo.  The gestational sac is elongated and there is no yolk sac visible.  No fetal heart rate identified.  CRL:  10.4 mm         7w  1d               [place] EDC: 02/01/2014  Maternal uterus/adnexae: No subchorionic hemorrhage.  Right ovary appears normal.  Small cyst is identified within the left adnexa measuring 1.5 cm.  IMPRESSION:  1.  There is a single intrauterine gestation.  This has an estimated gestational age of [redacted] weeks and 1 day. 2.  No evidence for fetal heart rate. The finding of a embryo with crown-rump length greater than are equal to 7 mm and no heart beat on transvaginal scan is definitive for failed pregnancy.   Original Report Authenticated By: Signa Kell, M.D.   US Ob Transvaginal  06/16/2013   , *RADIOLOGY REPORT*  Clinical Data: Vaginal bleeding.  OBSTETRIC <14 WK ULTRASOUND, TRANSVAGINAL OB US  Technique:  Transabdominal and transvaginal ultrasound was performed for evaluation of the gestation as well as the maternal uterus and adnexal regions.  Findings:  There is a single intrauterine gestational sac containing an embryo.  The gestational sac  is elongated and there is no yolk sac visible.  No fetal heart rate identified.  CRL:  10.4 mm         7w  1d               [place] EDC: 02/01/2014  Maternal uterus/adnexae: No subchorionic hemorrhage.  Right ovary appears normal.  Small cyst is identified within the left adnexa measuring 1.5 cm.  IMPRESSION:  1.  There is a single intrauterine gestation.  This has an estimated gestational age of [redacted] weeks and 1 day. 2.  No evidence for fetal heart rate. The finding of a embryo with crown-rump length greater than are equal to 7 mm and no heart beat on transvaginal scan is definitive for failed pregnancy.   Original Report Authenticated By: Signa Kell, M.D.     37 y.o. female with fetal demise at 7.[redacted] weeks gestation.  I have reviewed this patient's vital signs, nurses notes, appropriate labs and imaging.  I have discussed in detail with the patient findings and plan of care. Discussed options of follow up of SAB. Patient elects expectant management. We discussed in detail if she has heavy bleeding, more than a pad an hour, severe pain, feeling weak and dizzy, fever or any problems to go to [facility] for evaluation or return here as needed. She voices understanding. She will take Advil for pain.  Discussed with the patient that she will need follow up in 2 weeks for reevaluation of SAB.  [facility] Orlene Och, NP 06/16/13 1346  Hope  Orlene Och, NP 06/16/13 1347

## 2013-06-16 NOTE — ED Provider Notes (Signed)
Medical screening examination/treatment/procedure(s) were performed by non-physician practitioner and as supervising physician I was immediately available for consultation/collaboration.    Nelia Shi, MD 06/16/13 [phone]

## 2013-06-16 NOTE — ED Notes (Addendum)
States that she is pregnant, but doesn't know how far along she is. Now states that she is having a small amount of brownish d/c that she only notices when she wipes. Denies abd pain, and bleeding. Patient can not recall when her last period was, states "several months"

## 2013-06-16 NOTE — ED Notes (Signed)
Pelvic cart is at the bedside set up and ready for the doctor to use. 

## 2013-06-16 NOTE — ED Notes (Signed)
Urine collected by EMT Asher Muir.

## 2013-06-17 LAB — GC/CHLAMYDIA PROBE AMP: CT Probe RNA: NEGATIVE

## 2013-10-19 IMAGING — US US OB COMP LESS 14 WK
1 series · 14 of 28 positions shown · non-contrast
Comparison: none

, *RADIOLOGY REPORT*
CLINICAL DATA: Vaginal bleeding.

OBSTETRIC <14 WK ULTRASOUND, TRANSVAGINAL OB US
TECHNIQUE: Transabdominal and transvaginal ultrasound was
performed for evaluation of the gestation as well as the maternal
uterus and adnexal regions.

[Series 1: us ob comp less 14 wk · 0.23mm/px · 51 acquisitions, 14 frames shown]
[im 2/51]
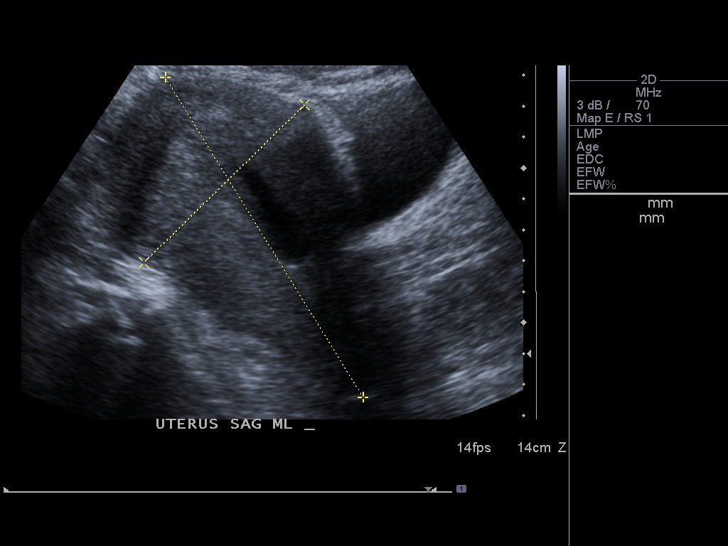
[im 6/51]
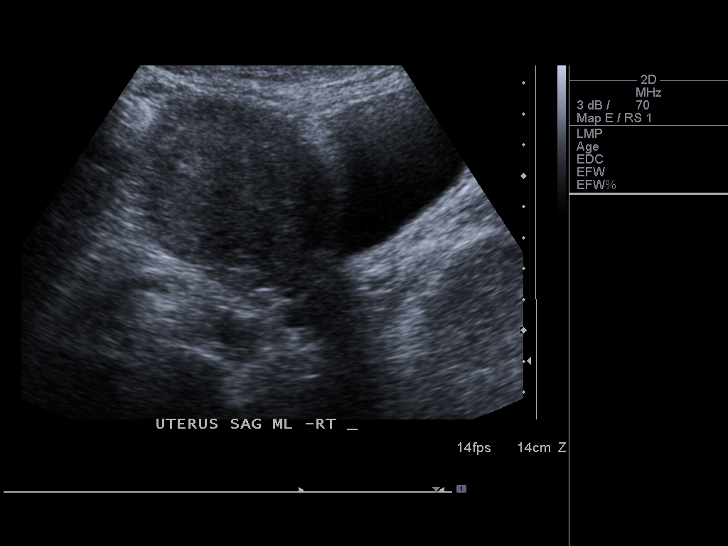
[im 10/51]
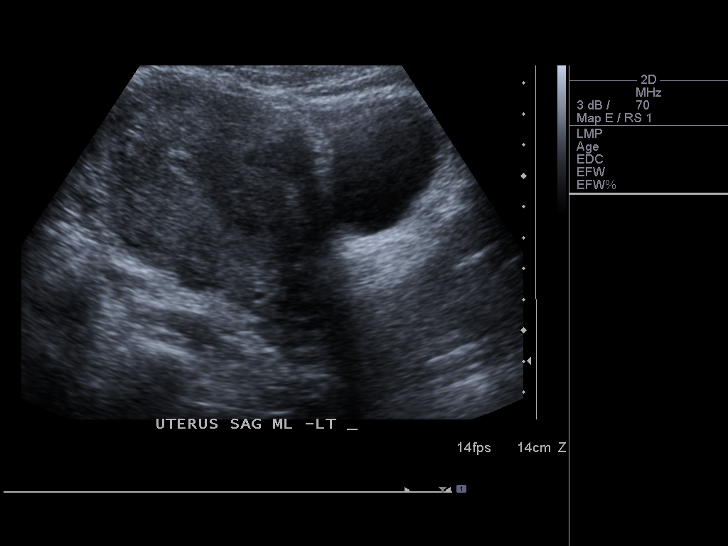
[im 13/51]
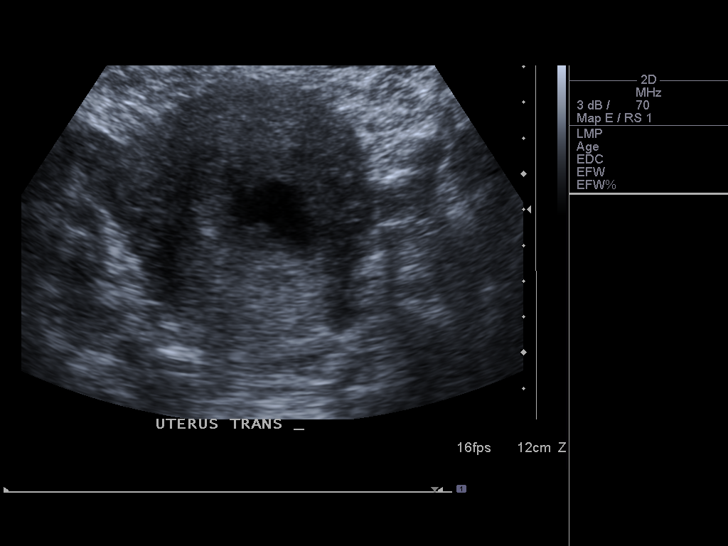
[im 17/51]
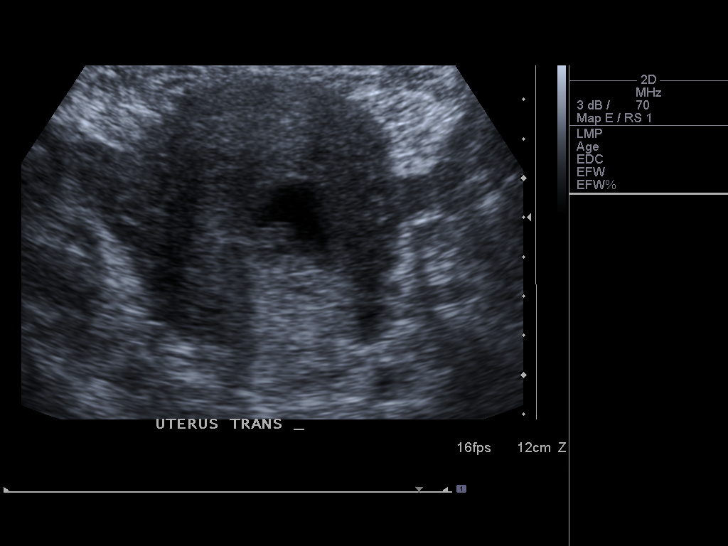
[im 21/51]
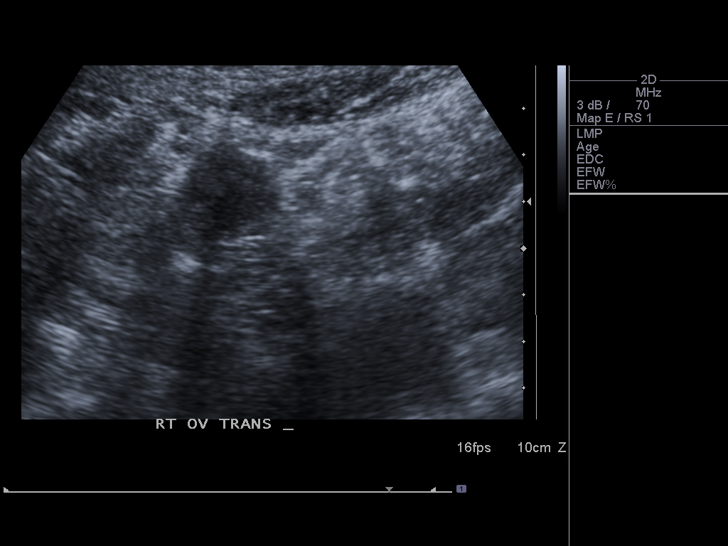
[im 25/51]
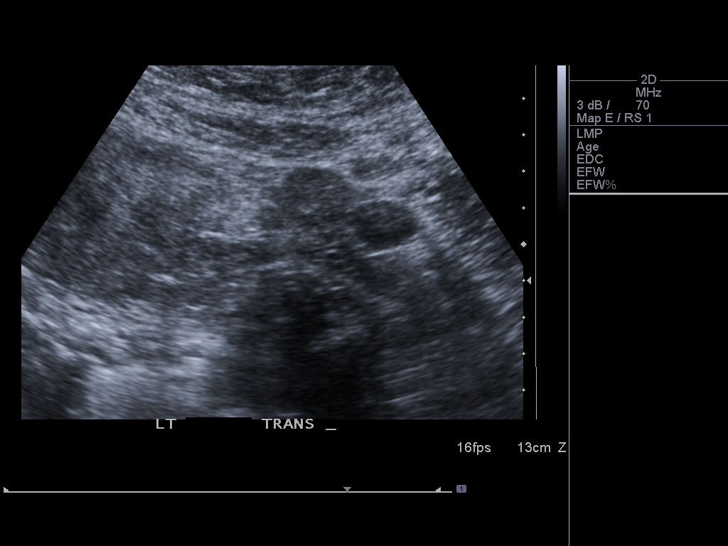
[im 28/51]
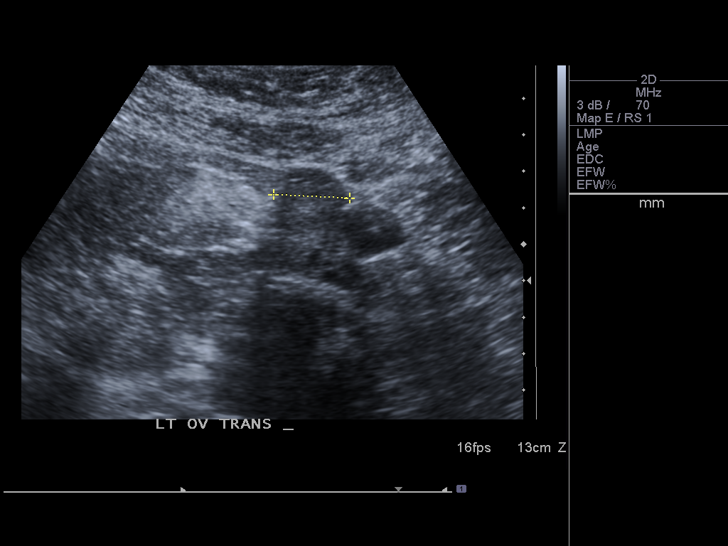
[im 32/51]
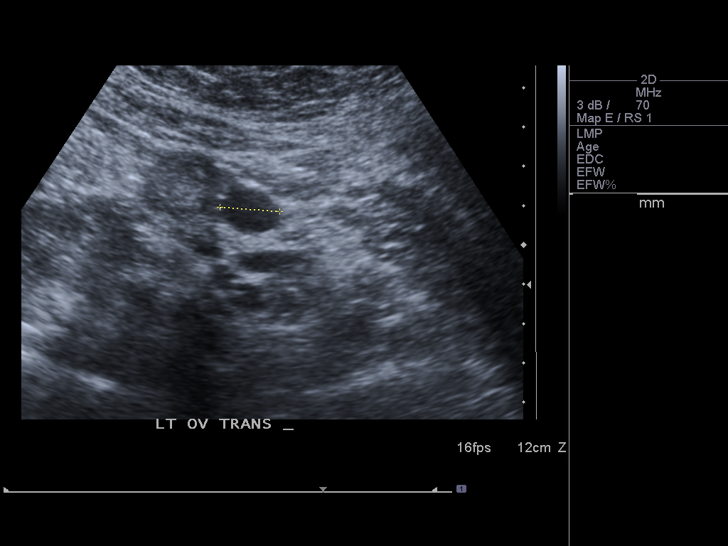
[im 36/51]
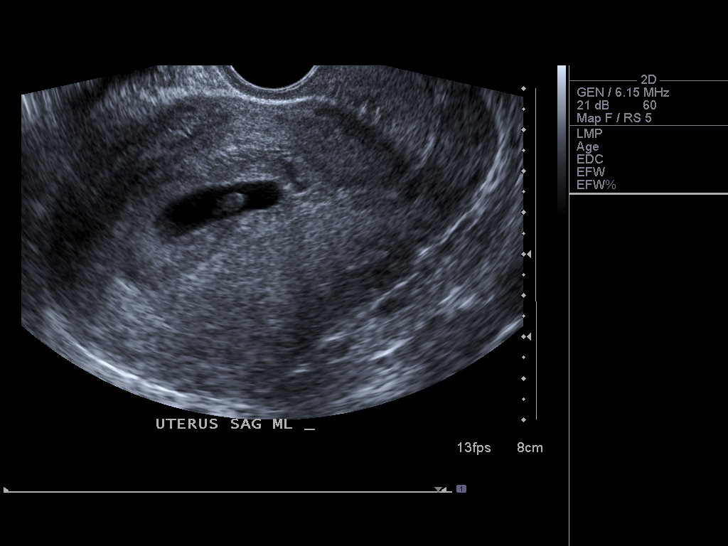
[im 39/51]
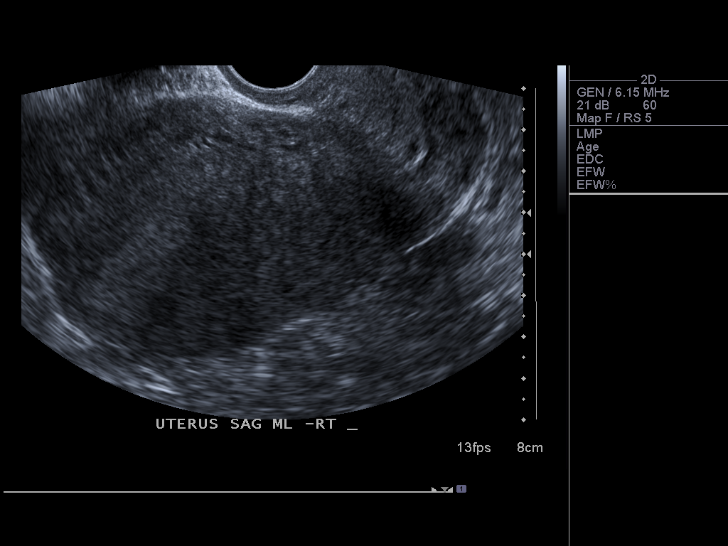
[im 43/51]
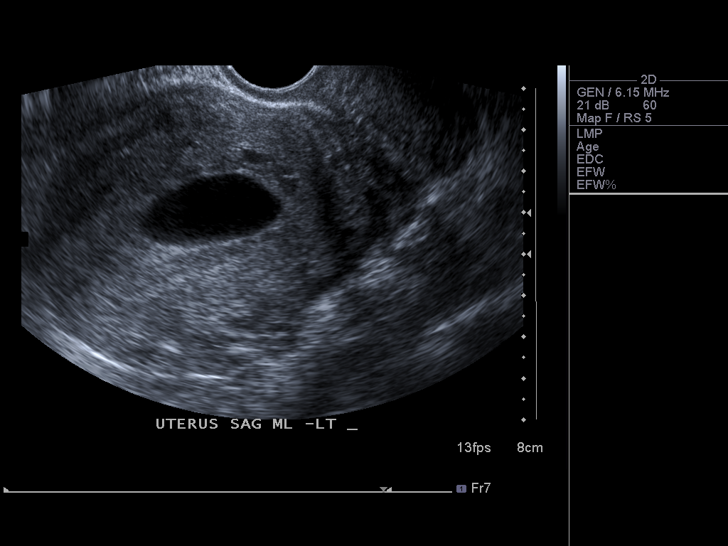
[im 47/51]
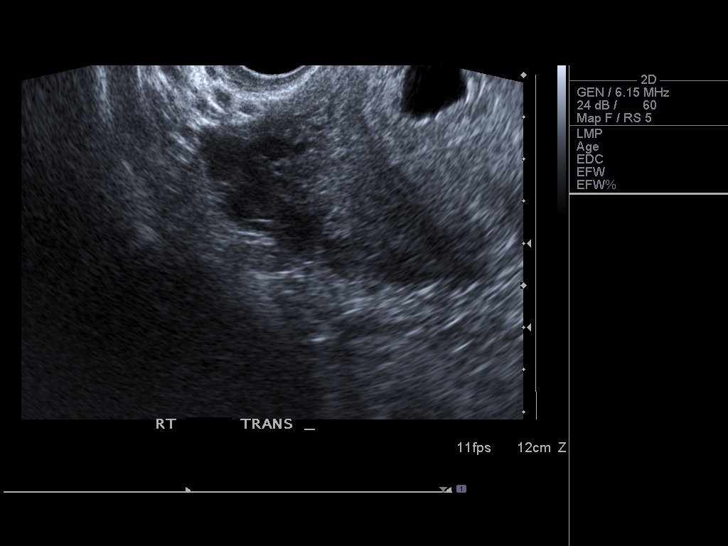
[im 51/51]
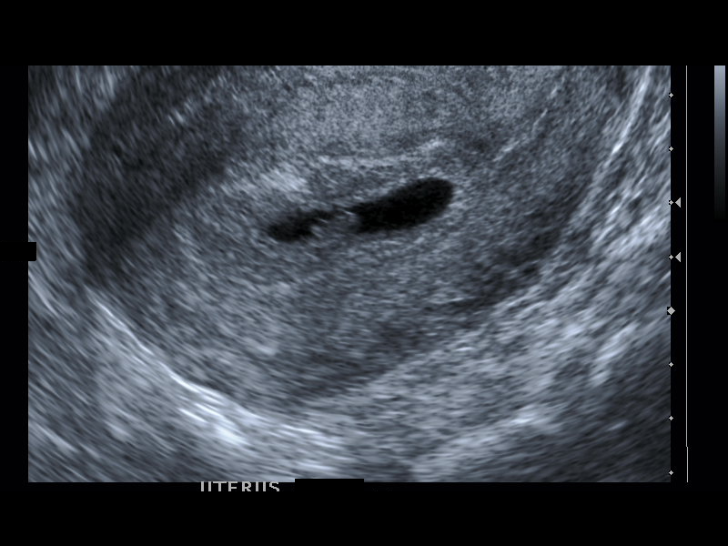

[14 of 28 positions shown; findings below may reference images not displayed]

FINDINGS: There is a single intrauterine gestational sac
containing an embryo.  The gestational sac is elongated and there
is no yolk sac visible.  No fetal heart rate identified.

CRL:  10.4 mm         7w  1d               [place] EDC: 02/01/2014

Maternal uterus/adnexae:
No subchorionic hemorrhage.  Right ovary appears normal.  Small
cyst is identified within the left adnexa measuring 1.5 cm.
IMPRESSION: 1.  There is a single intrauterine gestation.  This has an
estimated gestational age of 7 weeks and 1 day.
2.  No evidence for fetal heart rate. The finding of a embryo with
crown-rump length greater than are equal to 7 mm and no heart beat
on transvaginal scan is definitive for failed pregnancy.

## 2014-10-13 ENCOUNTER — Encounter (HOSPITAL_BASED_OUTPATIENT_CLINIC_OR_DEPARTMENT_OTHER): Payer: Self-pay | Admitting: *Deleted

## 2015-07-18 ENCOUNTER — Inpatient Hospital Stay (HOSPITAL_COMMUNITY): Payer: Medicaid Other

## 2015-07-18 ENCOUNTER — Encounter (HOSPITAL_COMMUNITY): Payer: Self-pay

## 2015-07-18 ENCOUNTER — Inpatient Hospital Stay (HOSPITAL_COMMUNITY)
Admission: AD | Admit: 2015-07-18 | Discharge: 2015-07-18 | Disposition: A | Discharge status: Home / Self Care | Payer: Medicaid Other | Source: Ambulatory Visit | Attending: Obstetrics & Gynecology | Admitting: Obstetrics & Gynecology

## 2015-07-18 DIAGNOSIS — O9989 Other specified diseases and conditions complicating pregnancy, childbirth and the puerperium: Secondary | ICD-10-CM | POA: Insufficient documentation

## 2015-07-18 DIAGNOSIS — E109 Type 1 diabetes mellitus without complications: Secondary | ICD-10-CM

## 2015-07-18 DIAGNOSIS — O3411 Maternal care for benign tumor of corpus uteri, first trimester: Secondary | ICD-10-CM | POA: Insufficient documentation

## 2015-07-18 DIAGNOSIS — Z3A09 9 weeks gestation of pregnancy: Secondary | ICD-10-CM | POA: Insufficient documentation

## 2015-07-18 DIAGNOSIS — D259 Leiomyoma of uterus, unspecified: Secondary | ICD-10-CM | POA: Diagnosis not present

## 2015-07-18 DIAGNOSIS — R509 Fever, unspecified: Secondary | ICD-10-CM | POA: Diagnosis not present

## 2015-07-18 DIAGNOSIS — O26899 Other specified pregnancy related conditions, unspecified trimester: Secondary | ICD-10-CM

## 2015-07-18 DIAGNOSIS — R102 Pelvic and perineal pain: Secondary | ICD-10-CM | POA: Diagnosis present

## 2015-07-18 DIAGNOSIS — R109 Unspecified abdominal pain: Secondary | ICD-10-CM | POA: Diagnosis not present

## 2015-07-18 HISTORY — DX: Benign neoplasm of connective and other soft tissue, unspecified: D21.9

## 2015-07-18 LAB — URINALYSIS, ROUTINE W REFLEX MICROSCOPIC
Bilirubin Urine: NEGATIVE
GLUCOSE, UA: NEGATIVE mg/dL
Hgb urine dipstick: NEGATIVE
KETONES UR: NEGATIVE mg/dL
Leukocytes, UA: NEGATIVE
NITRITE: NEGATIVE
PROTEIN: NEGATIVE mg/dL
SPECIFIC GRAVITY, URINE: 1.015 (ref 1.005–1.030)
Urobilinogen, UA: 0.2 mg/dL (ref 0.0–1.0)
pH: 7 (ref 5.0–8.0)

## 2015-07-18 LAB — WET PREP, GENITAL
CLUE CELLS WET PREP: NONE SEEN
Trich, Wet Prep: NONE SEEN
YEAST WET PREP: NONE SEEN

## 2015-07-18 LAB — POCT PREGNANCY, URINE: Preg Test, Ur: POSITIVE — AB

## 2015-07-18 NOTE — MAU Note (Signed)
Pt states here for lower abdominal/suprapubic pain since yesterday pm. Denies bleeding. Unsure how far along she is with pregnancy.

## 2015-07-18 NOTE — MAU Note (Signed)
Pt presents stating that she had a positive pregnancy test a couple of weeks ago that were positive. States she is trying to be seen at the dr but Bonnita Nasuti got cancelled. Wants to know how far along she is. States she had a fever of 100.4 last night. States she is also feeling tightening in her lower abdomen like a pulled muscle. Denies vaginal bleeding or abnormal discharge.

## 2015-07-18 NOTE — MAU Provider Note (Signed)
History     CSN: [id number]  Arrival date and time: 07/18/15 4268   First Provider Initiated Contact with Patient 07/18/15 1806      Chief Complaint  Patient presents with  . Pelvic Pain   This is a 39 y.o. female at [place] who presents with c/o lower abdominal pain, fever and tightening of lower abdomen. States she has not had a period since last baby a year ago, unsure dates. States temperature was 100.7 last night. Denies fever today. No one else in family has been sick. Reports dull crampy lower abdominal pain since yesterday. Denies bleeding. Denies other symptoms except nausea.   Has gotten care in past with Dr Garwin Brothers, Dr Micah Noel, and a midwife in [place]. Plans care in [place] or [facility].  Has not started any care yet. Today went to [place] and states they sent her here. Wants to know how far along she is.    Abdominal Pain This is a new problem. The current episode started yesterday. The onset quality is gradual. The problem occurs intermittently. The problem has been unchanged. The pain is located in the LLQ, RLQ and suprapubic region. The pain is mild. The quality of the pain is aching and cramping. The abdominal pain does not radiate. Associated symptoms include a fever and nausea. Pertinent negatives include no anorexia, constipation, diarrhea, dysuria, frequency, headaches, myalgias or vomiting. Nothing aggravates the pain. The pain is relieved by nothing. She has tried nothing for the symptoms.  RN Note:  Expand All Collapse All   Pt presents stating that she had a positive pregnancy test a couple of weeks ago that were positive. States she is trying to be seen at the dr but Bonnita Nasuti got cancelled. Wants to know how far along she is. States she had a fever of 100.4 last night. States she is also feeling tightening in her lower abdomen like a pulled muscle. Denies vaginal bleeding or abnormal discharge.           Expand All Collapse All   Pt states here  for lower abdominal/suprapubic pain since yesterday pm. Denies bleeding. Unsure how far along she is with pregnancy.            OB History    Gravida Para Term Preterm AB TAB SAB Ectopic Multiple Living   7 5 5  0 1 0 1 0 0 5      Past Medical History  Diagnosis Date  . Fibroids     Past Surgical History  Procedure Laterality Date  . Breast reduction surgery  2004    Family History  Problem Relation Age of Onset  . Hypertension Mother   . Hyperlipidemia Mother   . Bipolar disorder Maternal Aunt   . Diabetes    . Thyroid disease    . Hypertension      History  Substance Use Topics  . Smoking status: Never Smoker   . Smokeless tobacco: Not on file  . Alcohol Use: No    Allergies: No Known Allergies  Prescriptions prior to admission  Medication Sig Dispense Refill Last Dose  . nitrofurantoin, macrocrystal-monohydrate, (MACROBID) 100 MG capsule Take 100 mg by mouth 2 (two) times daily. For 7 days    Taking   Medical, Surgical, Family and Social histories reviewed and are listed above.  Medications and allergies reviewed.  Review of Systems  Constitutional: Positive for fever. Negative for chills and malaise/fatigue.  Gastrointestinal: Positive for nausea and abdominal pain. Negative for heartburn,  vomiting, diarrhea, constipation and anorexia.  Genitourinary: Negative for dysuria and frequency.  Musculoskeletal: Negative for myalgias and back pain.  Neurological: Negative for dizziness, weakness and headaches.  Other systems negative  Physical Exam   Blood pressure 122/83, pulse 105, temperature 98.5 F (36.9 C), temperature source Oral, resp. rate 16.  Physical Exam  Constitutional: She is oriented to person, place, and time. She appears well-developed and well-nourished. No distress.  HENT:  Head: Normocephalic.  Cardiovascular: Normal rate and regular rhythm.   Respiratory: Effort normal. No respiratory distress.  GI: Soft. She exhibits no  distension and no mass. There is no tenderness. There is no rebound and no guarding.  Genitourinary: Vagina normal. No vaginal discharge found.  Cervix long and closed No CMT Uterus difficult to palpate due to habitus Adnexa difficult to palpate due to habitus No pelvic tenderness  Musculoskeletal: Normal range of motion.  Neurological: She is alert and oriented to person, place, and time.  Skin: Skin is warm and dry.  Psychiatric: She has a normal mood and affect.    MAU Course  Procedures  MDM Bedside US showed single fetus with heartbeat in gestational sac, so no Quant HCG level was ordered. There was a possible placental abnormality, so I will order a formal US.   US Ob Comp Less 14 Wks  07/18/2015   CLINICAL DATA:  Patient with lower abdominal pain. Intrauterine pregnancy.  EXAM: OBSTETRIC <14 WK [place] AND TRANSVAGINAL OB US  TECHNIQUE: Both transabdominal and transvaginal ultrasound examinations were performed for complete evaluation of the gestation as well as the maternal uterus, adnexal regions, and pelvic cul-de-sac. Transvaginal technique was performed to assess early pregnancy.  COMPARISON:  Pelvic ultrasound 06/16/2013  FINDINGS: Intrauterine gestational sac: Visualized/normal in shape.  Yolk sac:  Present  Embryo:  Present  Cardiac Activity: Present  Heart Rate: 179  bpm  CRL:  23.5  mm   9 w   1 d                  [place] EDC: 02/19/2016  Maternal uterus/adnexae: Normal right left ovaries. No subchorionic hemorrhage. Two fibroids noted. No pelvic free fluid.  IMPRESSION: Single live intrauterine gestation.   Electronically Signed   By: Lovey Newcomer M.D.   On: 07/18/2015 19:21   US Ob Transvaginal  07/18/2015   CLINICAL DATA:  Patient with lower abdominal pain. Intrauterine pregnancy.  EXAM: OBSTETRIC <14 WK [place] AND TRANSVAGINAL OB US  TECHNIQUE: Both transabdominal and transvaginal ultrasound examinations were performed for complete evaluation of the gestation as well as the maternal  uterus, adnexal regions, and pelvic cul-de-sac. Transvaginal technique was performed to assess early pregnancy.  COMPARISON:  Pelvic ultrasound 06/16/2013  FINDINGS: Intrauterine gestational sac: Visualized/normal in shape.  Yolk sac:  Present  Embryo:  Present  Cardiac Activity: Present  Heart Rate: 179  bpm  CRL:  23.5  mm   9 w   1 d                  [place] EDC: 02/19/2016  Maternal uterus/adnexae: Normal right left ovaries. No subchorionic hemorrhage. Two fibroids noted. No pelvic free fluid.  IMPRESSION: Single live intrauterine gestation.   Electronically Signed   By: Lovey Newcomer M.D.   On: 07/18/2015 19:21     Assessment and Plan  A:  SIUP at [redacted]w[redacted]d       Abdominal cramping, unknown cause      Normal UA      Normal  [place]      Uterine fibroids      One episode of fever, none currently  P;  Discharge home       Tylenol OTC for fever PRN       Reassured baby is fine       Encouraged to seek prenatal care, number to our [place] office given        SAB precautions  [facility] 07/18/2015, 6:07 PM

## 2015-07-18 NOTE — Discharge Instructions (Signed)

## 2015-07-20 LAB — GC/CHLAMYDIA PROBE AMP (~~LOC~~) NOT AT ARMC
CHLAMYDIA, DNA PROBE: NEGATIVE
NEISSERIA GONORRHEA: NEGATIVE

## 2015-08-20 ENCOUNTER — Encounter (HOSPITAL_COMMUNITY): Payer: Self-pay | Admitting: Obstetrics and Gynecology

## 2015-08-21 ENCOUNTER — Other Ambulatory Visit (HOSPITAL_COMMUNITY): Payer: Self-pay | Admitting: Obstetrics and Gynecology

## 2015-08-21 DIAGNOSIS — O09522 Supervision of elderly multigravida, second trimester: Secondary | ICD-10-CM

## 2015-08-21 DIAGNOSIS — Z3A18 18 weeks gestation of pregnancy: Secondary | ICD-10-CM

## 2015-08-21 DIAGNOSIS — Z3689 Encounter for other specified antenatal screening: Secondary | ICD-10-CM

## 2015-09-22 ENCOUNTER — Other Ambulatory Visit (HOSPITAL_COMMUNITY): Payer: Self-pay | Admitting: Obstetrics and Gynecology

## 2015-09-22 ENCOUNTER — Encounter (HOSPITAL_COMMUNITY): Payer: Self-pay

## 2015-09-22 ENCOUNTER — Ambulatory Visit (HOSPITAL_COMMUNITY)
Admission: RE | Admit: 2015-09-22 | Discharge: 2015-09-22 | Disposition: A | Discharge status: Home / Self Care | Payer: Medicaid Other | Source: Ambulatory Visit | Attending: Obstetrics and Gynecology | Admitting: Obstetrics and Gynecology

## 2015-09-22 DIAGNOSIS — O99212 Obesity complicating pregnancy, second trimester: Secondary | ICD-10-CM

## 2015-09-22 DIAGNOSIS — O09522 Supervision of elderly multigravida, second trimester: Secondary | ICD-10-CM

## 2015-09-22 DIAGNOSIS — Z315 Encounter for genetic counseling: Secondary | ICD-10-CM | POA: Insufficient documentation

## 2015-09-22 DIAGNOSIS — E669 Obesity, unspecified: Secondary | ICD-10-CM | POA: Diagnosis not present

## 2015-09-22 DIAGNOSIS — Z3A18 18 weeks gestation of pregnancy: Secondary | ICD-10-CM | POA: Insufficient documentation

## 2015-09-22 DIAGNOSIS — Z3689 Encounter for other specified antenatal screening: Secondary | ICD-10-CM

## 2015-09-22 DIAGNOSIS — O09529 Supervision of elderly multigravida, unspecified trimester: Secondary | ICD-10-CM | POA: Insufficient documentation

## 2015-09-22 NOTE — Progress Notes (Signed)
Genetic Counseling  High-Risk Gestation Note  Appointment Date:  09/22/2015 Referred By: Caroll Rancher, MD Date of Birth:  June 08, 1976  Pregnancy History: [id number] Estimated Date of Delivery: 02/19/16 Estimated Gestational Age: [redacted]w[redacted]d Attending: Renella Cunas, MD   Ms. Leslie Richardson was seen for genetic counseling regarding a maternal age of 39 years old.  In summary:  Reviewed AMA and the associated risks for fetal aneuploidy  Reviewed the patient's normal Quad screen and the associated reduction in risks for fetal aneuploidy  Discussed the availability of ultrasound, NIPS, and amniocentesis  There were no anomalies or markers for aneuploidy visualized by ultrasound today  NIPS and amniocentesis were declined  Fam hx: FOB and his daughter (through a different partner) have isolated post-axial polydactyly  Risk of recurrence is ~50%  This was not observed by ultrasound today  She was counseled regarding maternal age and the association with risk for chromosome conditions due to nondisjunction with aging of the ova.  We reviewed chromosomes, nondisjunction, and the associated 1 in 80 risk for fetal aneuploidy related to a maternal age of 39 y.o. at [redacted]w[redacted]d weeks gestation.  She was counseled that the risk for aneuploidy decreases as gestational age increases, accounting for those pregnancies which spontaneously abort.  We specifically discussed Down syndrome (trisomy 2), trisomies 69 and 54, and sex chromosome aneuploidies (47,XXX and 47,XXY) including the common features and prognoses of each.   We also reviewed Leslie Richardson's maternal serum Quad screen result and the associated reduction in risks for fetal Down syndrome (1 in 6504), trisomy 18 (1 in 10,000), and ONTDs. We reviewed other available screening options including noninvasive prenatal screening (NIPS)/prenatal cell free DNA (cfDNA) testing and detailed ultrasound. She was counseled that screening tests are used to  modify a patient's a priori risk for aneuploidy, typically based on age. This estimate provides a pregnancy specific risk assessment. We reviewed the benefits and limitations of each option. Specifically, we discussed the conditions for which each test screens, the detection rates, and false positive rates of each. She was also counseled regarding diagnostic testing via amniocentesis. We reviewed the approximate 1 in 564-332 risk for complications for amniocentesis, including spontaneous pregnancy loss. After consideration of all the options, she elected to have a detailed ultrasound.  A complete ultrasound was performed today. The ultrasound report will be documented separately. There were no visualized fetal anomalies or markers suggestive of aneuploidy. Amniocentesis and NIPS were declined today.  She understands that screening tests cannot rule out all birth defects or genetic syndromes. The patient was advised of this limitation and states she still does not want additional testing at this time.   Leslie Richardson was provided with written information regarding sickle cell anemia (SCA) including the carrier frequency and incidence in the African-American population, the availability of carrier testing and prenatal diagnosis if indicated.  In addition, we discussed that hemoglobinopathies are routinely screened for as part of the [place] newborn screening panel.  She declined hemoglobin electrophoresis today.  Both family histories were reviewed and found to be contributory for the father of the baby and his daughter (through a different partner) having apparently isolated post-axial polydactyly.  She was counseled that polydactyly is typically an isolated trait that can occur sporadically in a person or inherited as a familial trait.  When inherited as an isolated trait, autosomal dominant inheritance is observed, meaning each pregnancy of a parent with polydactyly has a 50% (1 in 2) chance to inherit this genetic  predisposition for  polydactyly.  Not all individuals that inherit this predisposition would be born with polydactyly, but they would still have an increased risk to have children with the condition.  Less commonly, polydactyly may be one feature of an underlying genetic condition that may or may not be inherited. Given the reported family history of apparently isolated polydactyly in the father of the baby and his daughter, the chance of isolated, nonsyndromic post-axial polydactyly in the current pregnancy is ~50%.  This was not observed by ultrasound today. The remainder of both family histories were noncontributory for birth defects, intellectual disability, and known genetic conditions. Without further information regarding the provided family history, an accurate genetic risk cannot be calculated. Further genetic counseling is warranted if more information is obtained.  Leslie Richardson denied exposure to environmental toxins or chemical agents. She denied the use of alcohol, tobacco or street drugs. She denied significant viral illnesses during the course of her pregnancy.   I counseled Leslie Richardson regarding the above risks and available options. The approximate face-to-face time with the genetic counselor was 38 minutes.  Filbert Schilder, MS Certified Genetic Counselor

## 2015-09-24 ENCOUNTER — Other Ambulatory Visit (HOSPITAL_COMMUNITY): Payer: Self-pay | Admitting: Obstetrics and Gynecology

## 2015-10-05 ENCOUNTER — Encounter (HOSPITAL_COMMUNITY): Payer: Self-pay

## 2015-10-05 ENCOUNTER — Ambulatory Visit (HOSPITAL_COMMUNITY): Payer: Self-pay

## 2015-10-07 ENCOUNTER — Other Ambulatory Visit (HOSPITAL_COMMUNITY): Payer: Self-pay | Admitting: Obstetrics and Gynecology

## 2015-10-07 DIAGNOSIS — Z3689 Encounter for other specified antenatal screening: Secondary | ICD-10-CM

## 2015-11-30 ENCOUNTER — Ambulatory Visit (HOSPITAL_COMMUNITY)
Admission: RE | Admit: 2015-11-30 | Discharge: 2015-11-30 | Disposition: A | Discharge status: Home / Self Care | Payer: Medicaid Other | Source: Ambulatory Visit | Attending: Obstetrics and Gynecology | Admitting: Obstetrics and Gynecology

## 2015-11-30 ENCOUNTER — Encounter (HOSPITAL_COMMUNITY): Payer: Self-pay

## 2015-11-30 DIAGNOSIS — Z3A28 28 weeks gestation of pregnancy: Secondary | ICD-10-CM | POA: Insufficient documentation

## 2015-11-30 DIAGNOSIS — O99212 Obesity complicating pregnancy, second trimester: Secondary | ICD-10-CM | POA: Diagnosis not present

## 2015-11-30 DIAGNOSIS — Z3689 Encounter for other specified antenatal screening: Secondary | ICD-10-CM

## 2015-11-30 DIAGNOSIS — O3412 Maternal care for benign tumor of corpus uteri, second trimester: Secondary | ICD-10-CM | POA: Insufficient documentation

## 2015-11-30 DIAGNOSIS — O09522 Supervision of elderly multigravida, second trimester: Secondary | ICD-10-CM | POA: Diagnosis present

## 2016-01-25 IMAGING — US US MFM OB DETAIL+14 WK
1 series · 14 of 28 positions shown · non-contrast
Comparison: none

[Series 1: us mfm ob detail+14 wk · 118 acquisitions, 14 frames shown]
[im 5/118]
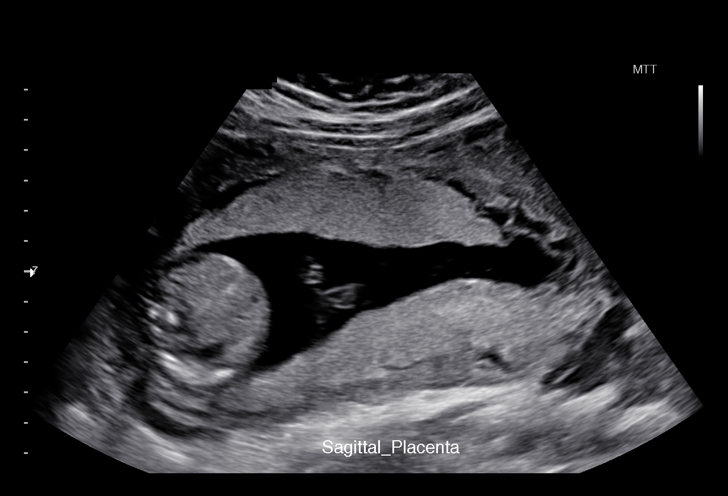
[im 14/118]
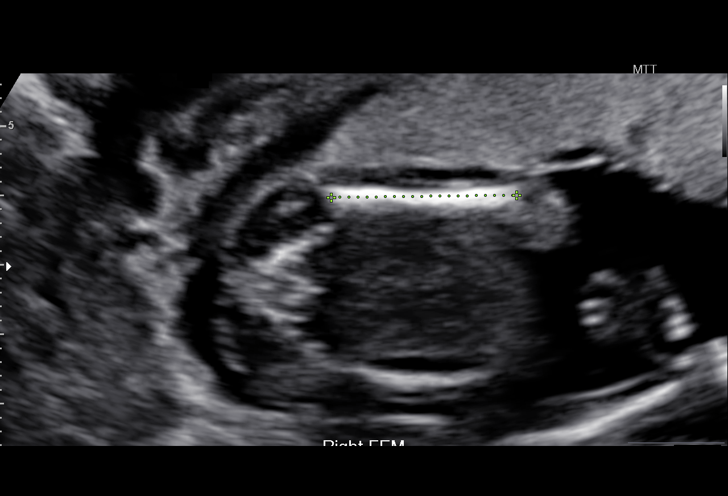
[im 22/118]
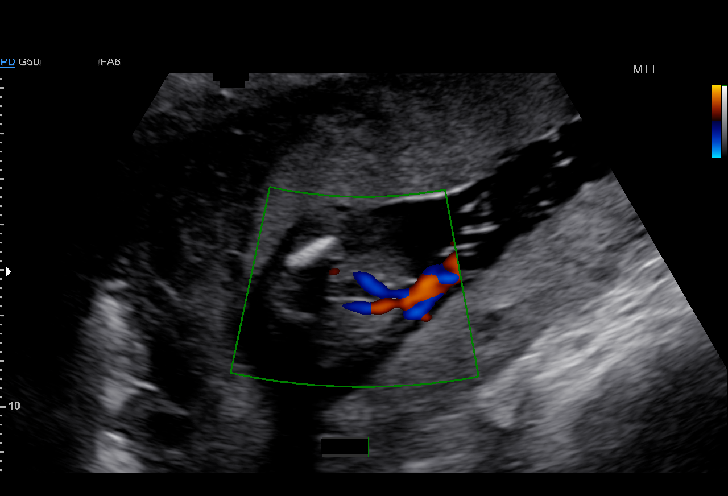
[im 31/118]
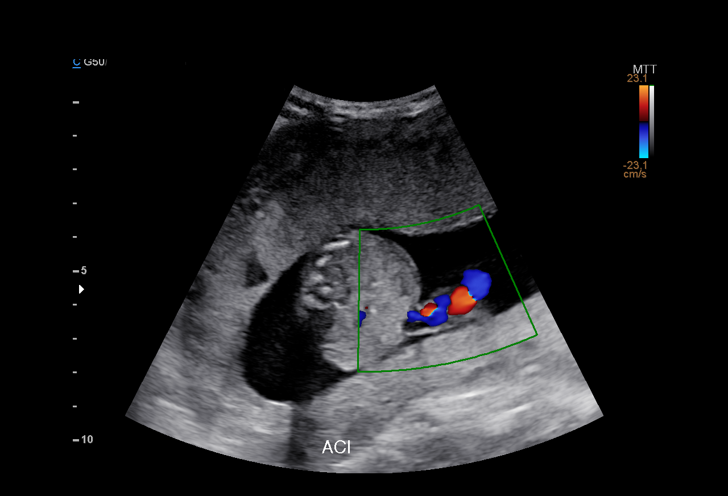
[im 40/118]
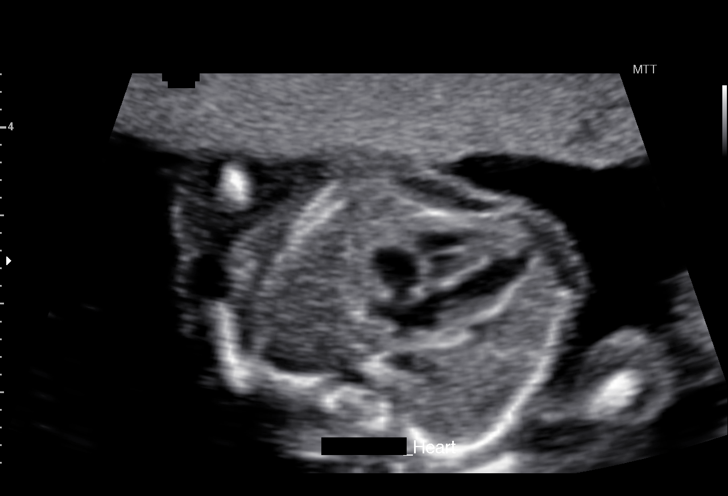
[im 48/118]
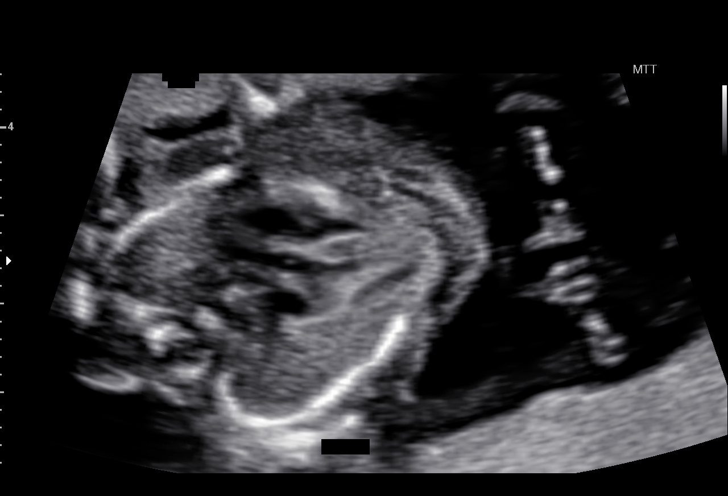
[im 57/118]
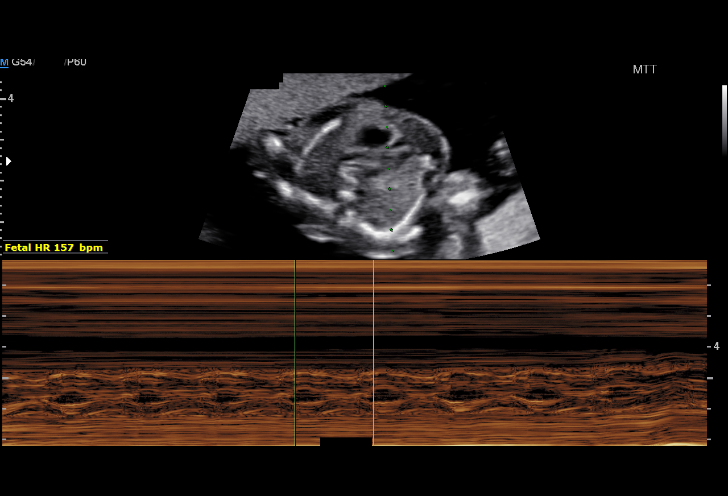
[im 66/118]
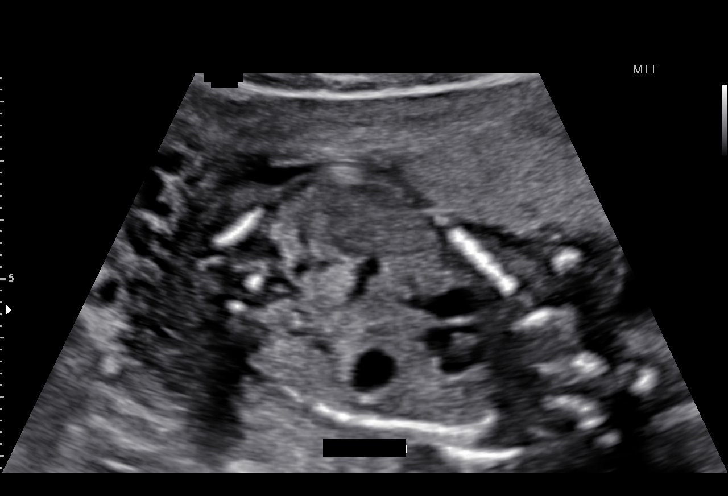
[im 74/118]
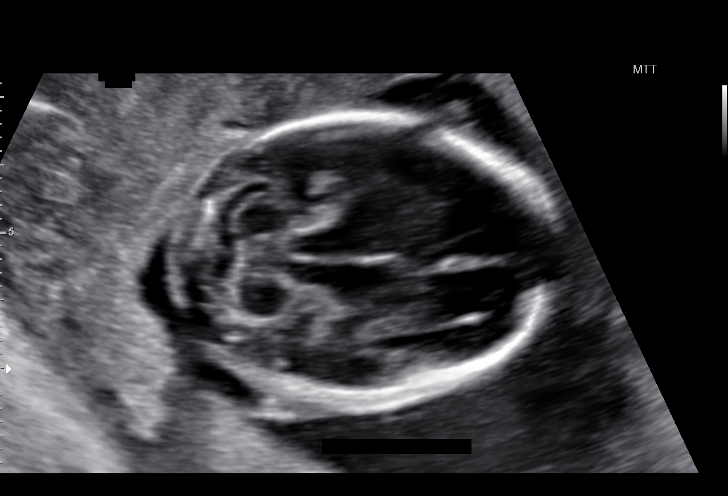
[im 83/118]
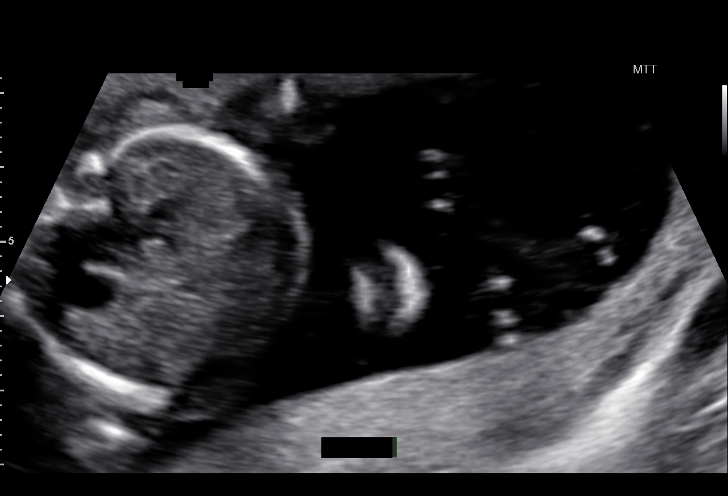
[im 92/118]
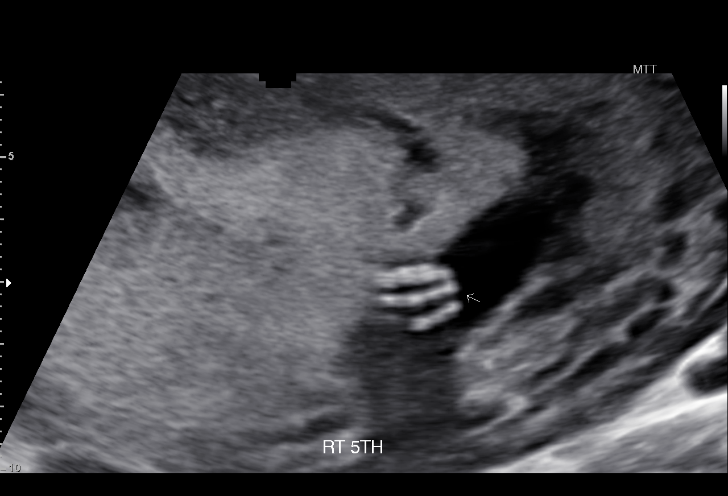
[im 100/118]
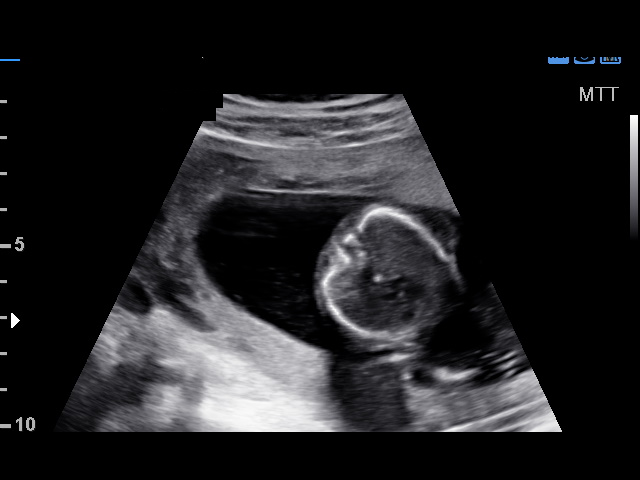
[im 109/118]
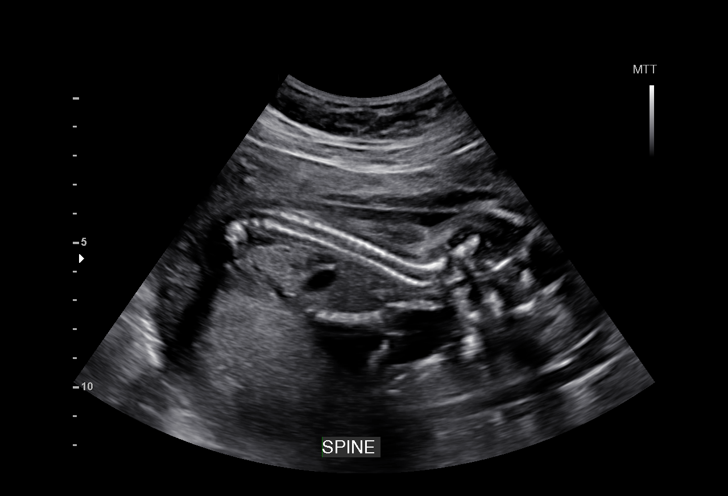
[im 118/118]
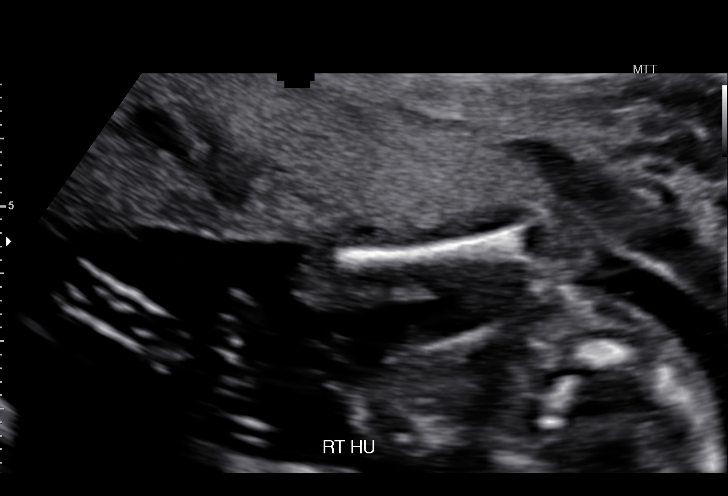

[14 of 28 positions shown; findings below may reference images not displayed]

OBSTETRICS REPORT
(Signed Final 09/22/2015 [DATE])

Date:

Department [REDACTED]
Service(s) Provided

Indications

18 weeks gestation of pregnancy
Advanced maternal age multigravida 35+, second
trimester; low risk quad screen
Detailed fetal anatomic survey                         Z36
Obesity complicating pregnancy, second
trimester
Fetal Evaluation

Num Of             1
Fetuses:
Fetal Heart        157                          bpm
Rate:
Cardiac Activity:  Observed
Presentation:      Variable
Placenta:          Anterior, above cervical
os
P. Cord            Visualized, central
Insertion:

Amniotic Fluid
AFI FV:      Subjectively within normal limits
Larg Pckt:    4.19   cm
Biometry

BPD:     40.8   m    G. Age:   18w 2d                 CI:        73.09   70 - 86
m
FL/HC:      16.7   16.1 -
18.3
HC:     151.7   m    G. Age:   18w 1d        25  %    HC/AC:      1.16   1.09 -
m
AC:     130.9   m    G. Age:   18w 4d        48  %    FL/BPD
m                                     :
FL:      25.3   m    G. Age:   17w 5d        16  %    FL/AC:      19.3   20 - 24
m
HUM:     26.9   m    G. Age:   18w 4d        52  %
m

Est.         227   gm    0 lb 8 oz      38   %
FW:
Gestational Age

U/S Today:     18w 1d                                         EDD:   02/22/16
Best:          18w 4d    Det. By:   Early Ultrasound          EDD:   02/19/16
(07/18/15)
Anatomy

Cranium:          Appears normal         Aortic Arch:       Appears normal
Fetal Cavum:      Appears normal         Ductal Arch:       Appears normal
Ventricles:       Appears normal         Diaphragm:         Appears normal
Choroid Plexus:   Appears normal         Stomach:           Appears normal,
left sided
Cerebellum:       Appears normal         Abdomen:           Appears normal
Posterior         Appears normal         Abdominal          Appears nml (cord
Fossa:                                   Wall:              insert, abd wall)
Nuchal Fold:      Appears normal         Cord Vessels:      Appears normal (3
vessel cord)
Face:             Appears normal         Kidneys:           Appear normal
(orbits and profile)
Lips:             Appears normal         Bladder:           Appears normal
Heart:            Appears normal         Spine:             Appears normal
(4CH, axis, and
situs)
RVOT:             Appears normal         Lower              Appears normal
Extremities:
LVOT:             Appears normal         Upper              Appears normal
Extremities:

Other:   Fetus appears to be a female. Heels and 5th digit visualized. Nasal
bone visualized. Technically difficult due to maternal habitus and
fetal position.
Cervix Uterus Adnexa

Cervix:       Normal appearance by transabdominal scan.
Uterus:       Multiple fibroids noted, see table below.
Cul De Sac:   No free fluid seen.

Left Ovary:    Within normal limits.
Right Ovary:   Within normal limits.

Adnexa:     No abnormality visualized.
Myomas

Site                     L(cm)       W(cm)      D(cm)      Location
Anterior
Fundus
Fundus
Blood Flow                  RI       PI        Comments

Impression

SIUP at 18+4 weeks
Normal detailed fetal anatomy
Markers of aneuploidy: none
Normal amniotic fluid volume
Measurements consistent with early US
Fibroid uterus: see above for size and location

After genetic counseling, Ms. Dejon declined further
testing for aneuploidy.
Recommendations

Follow-up ultrasound for growth in the early third trimester
(AMA/BMI/fibroids)

## 2016-05-22 ENCOUNTER — Encounter (HOSPITAL_COMMUNITY): Payer: Self-pay | Admitting: *Deleted
